# Patient Record
Sex: Male | Born: 1955 | Race: White | Hispanic: No | Marital: Married | Smoking: Never smoker
Health system: Southern US, Community
[De-identification: ages and names within clinical notes are randomized; demographics above are authoritative.]

## PROBLEM LIST (undated history)

## (undated) DIAGNOSIS — C801 Malignant (primary) neoplasm, unspecified: Secondary | ICD-10-CM

## (undated) DIAGNOSIS — Z22322 Carrier or suspected carrier of Methicillin resistant Staphylococcus aureus: Secondary | ICD-10-CM

## (undated) DIAGNOSIS — K219 Gastro-esophageal reflux disease without esophagitis: Secondary | ICD-10-CM

## (undated) DIAGNOSIS — E785 Hyperlipidemia, unspecified: Secondary | ICD-10-CM

## (undated) DIAGNOSIS — I10 Essential (primary) hypertension: Secondary | ICD-10-CM

## (undated) HISTORY — DX: Essential (primary) hypertension: I10

## (undated) HISTORY — DX: Gastro-esophageal reflux disease without esophagitis: K21.9

## (undated) HISTORY — DX: Hyperlipidemia, unspecified: E78.5

---

## 2002-08-05 ENCOUNTER — Ambulatory Visit (HOSPITAL_COMMUNITY): Admission: RE | Admit: 2002-08-05 | Discharge: 2002-08-05 | Payer: Self-pay

## 2002-08-11 ENCOUNTER — Ambulatory Visit (HOSPITAL_COMMUNITY): Admission: RE | Admit: 2002-08-11 | Discharge: 2002-08-11 | Payer: Self-pay

## 2009-10-22 HISTORY — PX: POLYPECTOMY: SHX149

## 2009-10-22 HISTORY — PX: COLONOSCOPY: SHX174

## 2009-11-22 ENCOUNTER — Ambulatory Visit: Payer: Self-pay | Admitting: Family Medicine

## 2009-11-22 DIAGNOSIS — R030 Elevated blood-pressure reading, without diagnosis of hypertension: Secondary | ICD-10-CM | POA: Insufficient documentation

## 2009-11-22 DIAGNOSIS — K219 Gastro-esophageal reflux disease without esophagitis: Secondary | ICD-10-CM | POA: Insufficient documentation

## 2009-11-22 LAB — CONVERTED CEMR LAB
ALT: 28 units/L (ref 0–53)
AST: 29 units/L (ref 0–37)
Albumin: 4.4 g/dL (ref 3.5–5.2)
Alkaline Phosphatase: 50 units/L (ref 39–117)
BUN: 18 mg/dL (ref 6–23)
Basophils Absolute: 0 10*3/uL (ref 0.0–0.1)
Basophils Relative: 0.7 % (ref 0.0–3.0)
Bilirubin Urine: NEGATIVE
Bilirubin, Direct: 0.1 mg/dL (ref 0.0–0.3)
CO2: 30 meq/L (ref 19–32)
Calcium: 9.2 mg/dL (ref 8.4–10.5)
Chloride: 108 meq/L (ref 96–112)
Cholesterol: 250 mg/dL — ABNORMAL HIGH (ref 0–200)
Creatinine, Ser: 1.1 mg/dL (ref 0.4–1.5)
Direct LDL: 182.4 mg/dL
Eosinophils Absolute: 0.1 10*3/uL (ref 0.0–0.7)
Eosinophils Relative: 2.2 % (ref 0.0–5.0)
GFR calc non Af Amer: 73.86 mL/min (ref >60)
Glucose, Bld: 97 mg/dL (ref 70–99)
Glucose, Urine, Semiquant: NEGATIVE
HCT: 43.2 % (ref 39.0–52.0)
HDL: 51.8 mg/dL (ref >39.00)
Hemoglobin: 14.4 g/dL (ref 13.0–17.0)
Ketones, urine, test strip: NEGATIVE
Lymphocytes Relative: 24.1 % (ref 12.0–46.0)
Lymphs Abs: 1.5 10*3/uL (ref 0.7–4.0)
MCHC: 33.3 g/dL (ref 30.0–36.0)
MCV: 99.3 fL (ref 78.0–100.0)
Monocytes Absolute: 0.5 10*3/uL (ref 0.1–1.0)
Monocytes Relative: 7.9 % (ref 3.0–12.0)
Neutro Abs: 4 10*3/uL (ref 1.4–7.7)
Neutrophils Relative %: 65.1 % (ref 43.0–77.0)
Nitrite: NEGATIVE
PSA: 1.73 ng/mL (ref 0.10–4.00)
Platelets: 183 10*3/uL (ref 150.0–400.0)
Potassium: 4.6 meq/L (ref 3.5–5.1)
Protein, U semiquant: NEGATIVE
RBC: 4.35 M/uL (ref 4.22–5.81)
RDW: 12 % (ref 11.5–14.6)
Sodium: 142 meq/L (ref 135–145)
Specific Gravity, Urine: 1.025
TSH: 0.94 microintl units/mL (ref 0.35–5.50)
Total Bilirubin: 0.6 mg/dL (ref 0.3–1.2)
Total CHOL/HDL Ratio: 5
Total Protein: 7.4 g/dL (ref 6.0–8.3)
Triglycerides: 92 mg/dL (ref 0.0–149.0)
Urobilinogen, UA: 0.2
VLDL: 18.4 mg/dL (ref 0.0–40.0)
WBC Urine, dipstick: NEGATIVE
WBC: 6.1 10*3/uL (ref 4.5–10.5)
pH: 5

## 2009-11-23 ENCOUNTER — Telehealth: Payer: Self-pay | Admitting: Family Medicine

## 2009-11-28 ENCOUNTER — Encounter (INDEPENDENT_AMBULATORY_CARE_PROVIDER_SITE_OTHER): Payer: Self-pay | Admitting: *Deleted

## 2009-12-19 ENCOUNTER — Ambulatory Visit: Payer: Self-pay | Admitting: Gastroenterology

## 2009-12-19 DIAGNOSIS — R1310 Dysphagia, unspecified: Secondary | ICD-10-CM | POA: Insufficient documentation

## 2009-12-20 ENCOUNTER — Ambulatory Visit: Payer: Self-pay | Admitting: Family Medicine

## 2009-12-20 ENCOUNTER — Ambulatory Visit: Payer: Self-pay | Admitting: Gastroenterology

## 2009-12-21 ENCOUNTER — Telehealth: Payer: Self-pay | Admitting: Gastroenterology

## 2009-12-26 ENCOUNTER — Ambulatory Visit: Payer: Self-pay | Admitting: Gastroenterology

## 2009-12-29 ENCOUNTER — Ambulatory Visit: Payer: Self-pay | Admitting: Cardiology

## 2009-12-30 ENCOUNTER — Encounter: Payer: Self-pay | Admitting: Gastroenterology

## 2010-11-21 NOTE — Progress Notes (Signed)
Summary: PT RETURNING CALL  Phone Note Call from Patient   Caller: Patient [phone] Reason for Call: Talk to Nurse, Talk to Doctor Summary of Call: Pt adv that he was returning a call to [facility].... Pt adv that his POE told him that Dr Nelida Meuse office had called to speak with him..... Pt adv that he can be reached @ [phone].  Initial call taken by: Debbra Riding,  November 23, 2009 11:26 AM  Follow-up for Phone Call        pt would like his lab results [phone] cell number Follow-up by: Kern Reap CMA Duncan Dull),  November 24, 2009 10:55 AM  Additional Follow-up for Phone Call Additional follow up Details #1::        I reviewed his laboratory findings.  He will go on a fat-free diet.  Follow-up lipid panel may then follow-up with me after fasting labs.  Urology consult because of hematuria.  He will call to make his own appointment Additional Follow-up by: Roderick Pee MD,  November 24, 2009 2:34 PM

## 2010-11-21 NOTE — Assessment & Plan Note (Signed)
Summary: 4 wk rov/jlp   Vital Signs:  Patient profile:   55 year old male Weight:      235 pounds BMI:     31.99 Temp:     97.8 degrees F oral BP sitting:   124 / 90  (left arm) Cuff size:   regular  Vitals Entered By: Raechel Ache, RN (December 20, 2009 10:20 AM) CC: 1 mo ROV.   CC:  1 mo ROV.Marland Kitchen  History of Present Illness: Andrew Fuentes is a 55 year old male, who comes back today for reevaluation of hypertension?  He was seen previously and blood pressure was elevated.  He purchased a digital blood pressure cuff has been monitoring his blood pressure at home.  Home blood pressures are 120 to 130 systolic, 70 to 80 diastolic.  We also referred him to GI.  He stated to have upper endoscopy for evaluation of a possible stricture.  Allergies: No Known Drug Allergies  Past History:  Past medical, surgical, family and social histories (including risk factors) reviewed for relevance to current acute and chronic problems.  Past Medical History: Reviewed history from 11/22/2009 and no changes required. 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 broken left wrist right hand nerve   Family History: Reviewed history from 11/22/2009 and no changes required. Father: copd Mother: deceased - lung disease Siblings: 1 brother - obese               2 sisters - healthy               2 children - healthy  Social History: Reviewed history from 11/22/2009 and no changes required. 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 Take one by mouth two times a day 2)  Multivitamins Tabs (Multiple vitamin) .... Take one by mouth once daily 3)  Vitamin D3 1000 Unit Caps (Cholecalciferol) .... Take one by mouth once daily 4)  Moviprep 100 Gm Solr (Peg-kcl-nacl-nasulf-na asc-c) .... As per prep instructions.  Patient Instructions: 1)  check your blood pressure weekly at home.  Return p.r.n.

## 2010-11-21 NOTE — Assessment & Plan Note (Signed)
Summary: BRAND NEW PT TO EST/CJR   Vital Signs:  Patient profile:   55 year old male Height:      72 inches Weight:      233 pounds BMI:     31.71 Temp:     98.6 degrees F oral BP sitting:   140 / 100  (left arm) Cuff size:   regular  Vitals Entered By: Kern Reap CMA Duncan Dull) (November 22, 2009 3:10 PM)  Reason for Visit new patient to establish  History of Present Illness: Andrew Fuentes  is a 55 -year-old, married male, nonsmoker who comes in today for evaluation as a new patient.  He has a history of recent onset elevation of blood pressure.  BP by me today 140/90 right arm sitting position.  Negative family history of hypertension.  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  He is also having trouble with reflux esophagitis.  He gets a funny taste in his throat.  Also is having recent difficulty swallowing food.  Review of systems otherwise negative  Preventive Screening-Counseling & Management  Alcohol-Tobacco     Smoking Status: never  Hep-HIV-STD-Contraception     Dental Visit-last 6 months no      Drug Use:  no.    Allergies (verified): No Known Drug Allergies  Past History:  Past medical, surgical, family and social histories (including risk factors) reviewed, and no changes noted (except as noted below).  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 Father: copd Mother: deceased - lung disease Siblings: 1 brother - obese               2 sisters - healthy               2 children - healthy  Social History: Reviewed history and no changes required. 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 No apparent alopecia or balding. Eyes:  No corneal or conjunctival inflammation noted. EOMI. Perrla. Funduscopic exam benign, without hemorrhages, exudates or papilledema. Vision grossly normal. Ears:  External ear exam shows no significant lesions or deformities.  Otoscopic examination reveals clear canals, tympanic membranes are intact bilaterally without bulging, retraction, inflammation or discharge. Hearing is grossly normal bilaterally. Nose:  External nasal examination shows no deformity or inflammation. Nasal mucosa are pink and moist without lesions or exudates. Mouth:  Oral mucosa and oropharynx without lesions or exudates.  Teeth in good repair. Neck:  No deformities, masses, or tenderness noted. Chest Wall:  No deformities, masses, tenderness or gynecomastia noted. Breasts:  No masses or gynecomastia noted Lungs:  Normal respiratory effort, chest expands symmetrically. Lungs are clear to auscultation, no crackles or wheezes. Heart:  Normal rate and regular rhythm. S1 and S2 normal without gallop, murmur, click, rub or other extra sounds. Abdomen:  Bowel sounds positive,abdomen soft and non-tender without masses, organomegaly or hernias noted. Rectal:  No external abnormalities noted. Normal sphincter tone. No rectal masses or tenderness. Genitalia:  Testes bilaterally descended without nodularity, tenderness or masses. No scrotal  masses or lesions. No penis lesions or urethral discharge. Prostate:  Prostate gland firm and smooth, no enlargement, nodularity, tenderness, mass, asymmetry or induration. Msk:  No deformity or scoliosis noted of thoracic or lumbar spine.   Pulses:  R and L carotid,radial,femoral,dorsalis pedis and posterior tibial pulses are full and equal bilaterally Extremities:  No  clubbing, cyanosis, edema, or deformity noted with normal full range of motion of all joints.   Neurologic:  No cranial nerve deficits noted. Station and gait are normal. Plantar reflexes are down-going bilaterally. DTRs are symmetrical throughout. Sensory, motor and coordinative functions appear intact. Skin:  Intact without suspicious lesions or rashes Cervical Nodes:  No lymphadenopathy noted Axillary Nodes:  No palpable lymphadenopathy Inguinal Nodes:  No significant adenopathy Psych:  Cognition and judgment appear intact. Alert and cooperative with normal attention span and concentration. No apparent delusions, illusions, hallucinations   Impression & Recommendations:  Problem # 1:  GERD (ICD-530.81) Assessment New  Orders: Venipuncture (52841) TLB-Lipid Panel (80061-LIPID) TLB-BMP (Basic Metabolic Panel-BMET) (80048-METABOL) TLB-CBC Platelet - w/Differential (85025-CBCD) TLB-Hepatic/Liver Function Pnl (80076-HEPATIC) TLB-TSH (Thyroid Stimulating Hormone) (84443-TSH) TLB-PSA (Prostate Specific Antigen) (84153-PSA) UA Dipstick w/o Micro (automated)  (81003) Gastroenterology Referral (GI)  Problem # 2:  ELEVATED BLOOD PRESSURE WITHOUT DIAGNOSIS OF HYPERTENSION (ICD-796.2) Assessment: New  Orders: Venipuncture (32440) TLB-Lipid Panel (80061-LIPID) TLB-BMP (Basic Metabolic Panel-BMET) (80048-METABOL) TLB-CBC Platelet - w/Differential (85025-CBCD) TLB-Hepatic/Liver Function Pnl (80076-HEPATIC) TLB-TSH (Thyroid Stimulating Hormone) (84443-TSH) TLB-PSA (Prostate Specific Antigen) (84153-PSA) UA Dipstick w/o Micro (automated)  (81003) EKG w/ Interpretation (93000)  Problem # 3:  Preventive Health Care (ICD-V70.0) Assessment: New  Patient Instructions: 1)  purchase a digital blood pressure cuff measure your blood pressure every morning.  Return in 4 weeks for follow-up with the data and the device. 2)   nothing to eat or drink for 3 hours prior to bedtime, sleep on two  pillows, take Prilosec OTC 20 mg twice a day.  We will get u  set up a consult in GI   Immunization History:  Tetanus/Td Immunization History:    Tetanus/Td:  historical (10/22/2009)  Laboratory Results   Urine Tests    Routine Urinalysis   Color: yellow Appearance: Clear Glucose: negative   (Normal Range: Negative) Bilirubin: negative   (Normal Range: Negative) Ketone: negative   (Normal Range: Negative) Spec. Gravity: 1.025   (Normal Range: 1.003-1.035) Blood: 2+   (Normal Range: Negative) pH: 5.0   (Normal Range: 5.0-8.0) Protein: negative   (Normal Range: Negative) Urobilinogen: 0.2   (Normal Range: 0-1) Nitrite: negative   (Normal Range: Negative) Leukocyte Esterace: negative   (Normal Range: Negative)    Comments: Rita Ohara  November 22, 2009 4:44 PM

## 2010-11-21 NOTE — Letter (Signed)
Summary: [facility] Instructions  [facility]  [address] [place], [place] [address]   Phone: [phone]  Fax: [phone]       Andrew Fuentes    Mar 12, 1956    MRN: [id number]        Procedure Day /Date:MONDAY 12/26/2009     Arrival Time:2:30PM     Procedure Time:3:30PM     Location of Procedure:                    X   [facility] (4th Floor)   PREPARATION FOR COLONOSCOPY WITH MOVIPREP   Starting 5 days prior to your procedure3/11/2009 do not eat nuts, seeds, popcorn, corn, beans, peas,  salads, or any raw vegetables.  Do not take any fiber supplements (e.g. Metamucil, Citrucel, and Benefiber).  THE DAY BEFORE YOUR PROCEDURE         DATE: 12/25/2009 DAY: SUNDAY  1.  Drink clear liquids the entire day-NO SOLID FOOD  2.  Do not drink anything colored red or purple.  Avoid juices with pulp.  No orange juice.  3.  Drink at least 64 oz. (8 glasses) of fluid/clear liquids during the day to prevent dehydration and help the prep work efficiently.  CLEAR LIQUIDS INCLUDE: Water Jello Ice Popsicles Tea (sugar ok, no milk/cream) Powdered fruit flavored drinks Coffee (sugar ok, no milk/cream) Gatorade Juice: apple, white grape, white cranberry  Lemonade Clear bullion, consomm, broth Carbonated beverages (any kind) Strained chicken noodle soup Hard Candy                             4.  In the morning, mix first dose of MoviPrep solution:    Empty 1 Pouch A and 1 Pouch B into the disposable container    Add lukewarm drinking water to the top line of the container. Mix to dissolve    Refrigerate (mixed solution should be used within 24 hrs)  5.  Begin drinking the prep at 5:00 p.m. The MoviPrep container is divided by 4 marks.   Every 15 minutes drink the solution down to the next mark (approximately 8 oz) until the full liter is complete.   6.  Follow completed prep with 16 oz of clear liquid of your choice (Nothing red or purple).  Continue to  drink clear liquids until bedtime.  7.  Before going to bed, mix second dose of MoviPrep solution:    Empty 1 Pouch A and 1 Pouch B into the disposable container    Add lukewarm drinking water to the top line of the container. Mix to dissolve    Refrigerate  THE DAY OF YOUR PROCEDURE      DATE: 12/26/2009  DAY: MONDAY  Beginning at 10:30a.m. (5 hours before procedure):         1. Every 15 minutes, drink the solution down to the next mark (approx 8 oz) until the full liter is complete.  2. Follow completed prep with 16 oz. of clear liquid of your choice.    3. You may drink clear liquids until 1:30PM(2 HOURS BEFORE PROCEDURE).   MEDICATION INSTRUCTIONS  Unless otherwise instructed, you should take regular prescription medications with a small sip of water   as early as possible the morning of your procedure.        OTHER INSTRUCTIONS  You will need a responsible adult at least 56 years of age to accompany you and drive you  home.   This person must remain in the waiting room during your procedure.  Wear loose fitting clothing that is easily removed.  Leave jewelry and other valuables at home.  However, you may wish to bring a book to read or  an iPod/MP3 player to listen to music as you wait for your procedure to start.  Remove all body piercing jewelry and leave at home.  Total time from sign-in until discharge is approximately 2-3 hours.  You should go home directly after your procedure and rest.  You can resume normal activities the  day after your procedure.  The day of your procedure you should not:   Drive   Make legal decisions   Operate machinery   Drink alcohol   Return to work  You will receive specific instructions about eating, activities and medications before you leave.    The above instructions have been reviewed and explained to me by   _______________________    I fully understand and can verbalize these instructions  _____________________________ Date _________

## 2010-11-21 NOTE — Progress Notes (Signed)
Summary: CTA Scheduled  Phone Note Outgoing Call   Call placed by: Laureen Ochs LPN,  December 21, 2009 9:27 AM Call placed to: Patient Summary of Call: On Endoscopy 12-20-09 pt. 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 He is scheduled for a CT at [facility] on 12-29-09 at 9:30am. Appt. information reviewed w/pt. by phone and his daughter Kelton Pillar will take his contrast and written instructions to him. Pt. instructed to call back as needed.  Initial call taken by: Laureen Ochs LPN,  December 21, 2009 9:34 AM

## 2010-11-21 NOTE — Procedures (Signed)
Summary: Colonoscopy  Patient: Andrew Fuentes Note: All result statuses are Final unless otherwise noted.  Tests: (1) Colonoscopy (COL)   COL Colonoscopy           DONE     [facility]     [address]     [place], [place]  [address]           COLONOSCOPY PROCEDURE REPORT           PATIENT:  Andrew Fuentes, Andrew Fuentes  MR#:  [id number]     BIRTHDATE:  [birth year], 54 yrs. old  GENDER:  male           ENDOSCOPIST:  Barbette Hair. Arlyce Dice, MD     Referred by:           PROCEDURE DATE:  12/26/2009     PROCEDURE:  Colonoscopy with snare polypectomy, Colon with cold     biopsy polypectomy     ASA CLASS:  Class II     INDICATIONS:  Routine Risk Screening           MEDICATIONS:   Fentanyl 75 mcg IV, Versed 9 mg IV           DESCRIPTION OF PROCEDURE:   After the risks benefits and     alternatives of the procedure were thoroughly explained, informed     consent was obtained.  Digital rectal exam was performed and     revealed no abnormalities.   The LB [id number] endoscope     was introduced through the anus and advanced to the cecum, which     was identified by both the appendix and ileocecal valve, without     limitations.  The quality of the prep was good, using MoviPrep.     The instrument was then slowly withdrawn as the colon was fully     examined.     <<PROCEDUREIMAGES>>           FINDINGS:  A sessile polyp was found in the cecum. It was 2 mm in     size. The polyp was removed using cold biopsy forceps (see     image5).  A sessile polyp was found in the ascending colon. It was     3 mm in size. Polyp was snared without cautery. Retrieval was     successful (see image7). snare polyp  This was otherwise a normal     examination of the colon (see image1, image2, image8, image9,     image10, image11, image13, image14, image17, image16, and     image18).   Retroflexed views in the rectum revealed no     abnormalities.    The scope was then withdrawn from the patient     and  the procedure completed.           COMPLICATIONS:  None           ENDOSCOPIC IMPRESSION:     1) 2 mm sessile polyp in the cecum     2) 3 mm sessile polyp in the ascending colon     3) Otherwise normal examination     RECOMMENDATIONS:     1) If the polyp(s) removed today are proven to be adenomatous     (pre-cancerous) polyps, you will need a repeat colonoscopy in 5     years. Otherwise you should continue to follow colorectal cancer     screening guidelines for "routine risk" patients with colonoscopy     in 10 years.  REPEAT EXAM:   You will receive a letter from Dr. Arlyce Dice in 1-2     weeks, after reviewing the final pathology, with followup     recommendations.           ______________________________     Barbette Hair Arlyce Dice, MD           CC:  Roderick Pee, MD           n.     Rosalie Doctor:   Barbette Hair. Kaplan at 12/26/2009 04:25 PM           Ramond Craver, [phone]  Note: An exclamation mark (!) indicates a result that was not dispersed into the flowsheet. Document Creation Date: 12/26/2009 4:25 PM _______________________________________________________________________  (1) Order result status: Final Collection or observation date-time: 12/26/2009 16:19 Requested date-time:  Receipt date-time:  Reported date-time:  Referring Physician:   Ordering Physician: Melvia Heaps [phone]) Specimen Source:  Source: Launa Grill Order Number: [id number] Lab site:   Appended Document: Colonoscopy     Procedures Next Due Date:    Colonoscopy: 12/2014

## 2010-11-21 NOTE — Letter (Signed)
Summary: Results Letter  [facility]  [address] [place], [place] [address]   Phone: [phone]  Fax: [phone]        December 19, 2009 MRN: [id number]    Andrew Fuentes [address] [place], [place]  [address]    Dear Mr. Kissinger,  It is my pleasure to have treated you recently as a new patient in my office. I appreciate your confidence and the opportunity to participate in your care.  Since I do have a busy inpatient endoscopy schedule and office schedule, my office hours vary weekly. I am, however, available for emergency calls everyday through my office. If I am not available for an urgent office appointment, another one of our gastroenterologist will be able to assist you.  My well-trained staff are prepared to help you at all times. For emergencies after office hours, a physician from our Gastroenterology section is always available through my 24 hour answering service  Once again I welcome you as a new patient and I look forward to a happy and healthy relationship            Sincerely,  Louis Meckel MD  This letter has been electronically signed by your physician.  Appended Document: Results Letter letter mailed

## 2010-11-21 NOTE — Letter (Signed)
Summary: EGD Instructions  [facility]  [address] [place], [place] [address]   Phone: [phone]  Fax: [phone]       Andrew Fuentes    [birth year]    MRN: [id number]       Procedure Day /Date:TUESDAY 12/20/2009     Arrival Time: 2:30PM     Procedure Time:3:30PM     Location of Procedure:                    X [facility] (4th Floor)    PREPARATION FOR ENDOSCOPY/DIL   On 12/20/2009  THE DAY OF THE PROCEDURE:  1.   No solid foods, milk or milk products are allowed after midnight the night before your procedure.  2.   Do not drink anything colored red or purple.  Avoid juices with pulp.  No orange juice.  3.  You may drink clear liquids until 1:30PM, which is 2 hours before your procedure.                                                                                                CLEAR LIQUIDS INCLUDE: Water Jello Ice Popsicles Tea (sugar ok, no milk/cream) Powdered fruit flavored drinks Coffee (sugar ok, no milk/cream) Gatorade Juice: apple, white grape, white cranberry  Lemonade Clear bullion, consomm, broth Carbonated beverages (any kind) Strained chicken noodle soup Hard Candy   MEDICATION INSTRUCTIONS  Unless otherwise instructed, you should take regular prescription medications with a small sip of water as early as possible the morning of your procedure.           OTHER INSTRUCTIONS  You will need a responsible adult at least 56 years of age to accompany you and drive you home.   This person must remain in the waiting room during your procedure.  Wear loose fitting clothing that is easily removed.  Leave jewelry and other valuables at home.  However, you may wish to bring a book to read or an iPod/MP3 player to listen to music as you wait for your procedure to start.  Remove all body piercing jewelry and leave at home.  Total time from sign-in until discharge is approximately 2-3 hours.  You should go home  directly after your procedure and rest.  You can resume normal activities the day after your procedure.  The day of your procedure you should not:   Drive   Make legal decisions   Operate machinery   Drink alcohol   Return to work  You will receive specific instructions about eating, activities and medications before you leave.    The above instructions have been reviewed and explained to me by   _______________________    I fully understand and can verbalize these instructions _____________________________ Date _________

## 2010-11-21 NOTE — Letter (Signed)
Summary: Results Letter  [facility]  [address] [place], [place] [address]   Phone: [phone]  Fax: [phone]        December 30, 2009 MRN: [id number]    Andrew Fuentes [address] [place], [place]  [address]    Dear Mr. [name],  Your CT results did not show any remarkable findings. There is a prominent artery which explains the findings on endosocpy.  There is no aneurism.  Please continue with the recommendations previously discussed.  Should you have any further questions or immediate concers, feel free to contact me.  Sincerely,  Barbette Hair. Arlyce Dice, M.D., [facility]          Sincerely,  Louis Meckel MD  This letter has been electronically signed by your physician.  Appended Document: Results Letter Letter mailed to patient.

## 2010-11-21 NOTE — Procedures (Signed)
Summary: Upper Endoscopy  Patient: Osher Oettinger Note: All result statuses are Final unless otherwise noted.  Tests: (1) Upper Endoscopy (EGD)   EGD Upper Endoscopy       DONE     [facility]     [address]     [place], [place]  [address]           ENDOSCOPY PROCEDURE REPORT           PATIENT:  Hilmar, Moldovan  MR#:  [id number]     BIRTHDATE:  1956-07-15, 54 yrs. old  GENDER:  male           ENDOSCOPIST:  Barbette Hair. Arlyce Dice, MD     Referred by:  Eugenio Hoes Tawanna Cooler, M.D.           PROCEDURE DATE:  12/20/2009     PROCEDURE:  EGD, diagnostic, Maloney Dilation of Esophagus     ASA CLASS:  Class II     INDICATIONS:  dysphagia           MEDICATIONS:   Fentanyl 50 mcg IV, Versed 7 mg IV, glycopyrrolate     (Robinal) 0.2 mg IV, 0.6cc simethancone 0.6 cc PO     TOPICAL ANESTHETIC:  Exactacain Spray           DESCRIPTION OF PROCEDURE:   After the risks benefits and     alternatives of the procedure were thoroughly explained, informed     consent was obtained.  The LB GIF-H180 [id number] endoscope was     introduced through the mouth and advanced to the third portion of     the duodenum, without limitations.  The instrument was slowly     withdrawn as the mucosa was fully examined.     <<PROCEDUREIMAGES>>           A stricture was found at the gastroesophageal junction (see     image1). Early esophageal stricture Dilation with maloney dilator     18mm Mild resistance; no heme  A mass was found in the fundus (see     image3). 3cm pulsatile submucosal mass; normal overlying mucosa     Otherwise the examination was normal.    Retroflexed views revealed     no abnormalities.    The scope was then withdrawn from the patient     and the procedure completed.           COMPLICATIONS:  None           ENDOSCOPIC IMPRESSION:     1) Stricture at the gastroesophageal junction     2) Submucosal mass in the fundus - r/o aneurism     3) Otherwise normal examination      RECOMMENDATIONS:     1) dilatations PRN     2) CT Scan           REPEAT EXAM:  No           ______________________________     Barbette Hair. Arlyce Dice, MD           CC:           n.     eSIGNED:   Barbette Hair. Jaquis Picklesimer at 12/20/2009 04:14 PM           Ramond Craver, [id number]  Note: An exclamation mark (!) indicates a result that was not dispersed into the flowsheet. Document Creation Date: 12/20/2009 4:14 PM _______________________________________________________________________  (1) Order result status: Final Collection or observation date-time: 12/20/2009 16:08 Requested  date-time:  Receipt date-time:  Reported date-time:  Referring Physician:   Ordering Physician: Melvia Heaps [phone]) Specimen Source:  Source: Launa Grill Order Number: [id number] Lab site:

## 2010-11-21 NOTE — Assessment & Plan Note (Signed)
Summary: Problems swallowing--ch.   History of Present Illness Visit Type: Initial Consult Primary GI MD: Melvia Heaps MD [facility] Primary Provider: Kelle Darting, MD Requesting Provider: Kelle Darting, MD Chief Complaint: Patient complains of acid refluc and belching. Patient complains of dysphagia that started years ago. History of Present Illness:   Andrew Fuentes is a 55 year old white male referred at the request of Dr. Tawanna Cooler for evaluation of dysphagia.  Over the past year he has had progressive dysphagia to solids and now liquids.  Weight has been stable.  He has occasional belching and pyrosis.  He is on Prilosec twice a day.   GI Review of Systems    Reports acid reflux, belching, dysphagia with liquids, and  dysphagia with solids.      Denies abdominal pain, bloating, chest pain, heartburn, loss of appetite, nausea, vomiting, vomiting blood, weight loss, and  weight gain.        Denies anal fissure, black tarry stools, change in bowel habit, constipation, diarrhea, diverticulosis, fecal incontinence, heme positive stool, hemorrhoids, irritable bowel syndrome, jaundice, light color stool, liver problems, rectal bleeding, and  rectal pain.    Current Medications (verified): 1)  Prilosec Otc 20 Mg Tbec (Omeprazole Magnesium) .... Take One By Mouth Two Times A Day 2)  Multivitamins  Tabs (Multiple Vitamin) .... Take One By Mouth Once Daily 3)  Vitamin D3 1000 Unit Caps (Cholecalciferol) .... Take One By Mouth Once Daily  Allergies (verified): No Known Drug Allergies  Past History:  Past Medical History: Reviewed history from 11/22/2009 and no changes required. 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 broken left wrist right hand nerve   Family History: Father: copd Mother: deceased - lung disease Siblings: 1 brother - obese               2 sisters -  healthy               2 children - healthy No FH of Colon Cancer:  Social History: Occupation:mechanic Married Never Smoked Alcohol use-yes Drug use-no Daily Caffeine Use 2 per day  Review of Systems       The patient complains of blood in urine, cough, and headaches-new.  The patient denies allergy/sinus, anemia, anxiety-new, arthritis/joint pain, back pain, breast changes/lumps, change in vision, confusion, coughing up blood, depression-new, fainting, fatigue, fever, hearing problems, heart murmur, heart rhythm changes, itching, menstrual pain, muscle pains/cramps, night sweats, nosebleeds, pregnancy symptoms, shortness of breath, skin rash, sleeping problems, sore throat, swelling of feet/legs, swollen lymph glands, thirst - excessive , urination - excessive , urination changes/pain, urine leakage, vision changes, and voice change.         All other systems were reviewed and were negative   Vital Signs:  Patient profile:   55 year old male Height:      72 inches Weight:      230.4 pounds BMI:     31.36 Pulse rate:   64 / minute Pulse rhythm:   regular BP sitting:   120 / 86  (left arm) Cuff size:   regular  Vitals Entered By: Harlow Mares CMA Duncan Dull) (December 19, 2009 11:10 AM)  Physical Exam  Additional Exam:  On physical exam he is well-developed well-nourished male  skin: anicteric HEENT: normocephalic; PEERLA; no nasal or pharyngeal abnormalities neck: supple nodes: no cervical lymphadenopathy chest: clear to ausculatation and percussion heart: no murmurs,  gallops, or rubs abd: soft, nontender; BS normoactive; no abdominal masses, tenderness, organomegaly rectal: deferred ext: no cynanosis, clubbing, edema skeletal: no deformities neuro: oriented x 3; no focal abnormalities    Impression & Recommendations:  Problem # 1:  DYSPHAGIA UNSPECIFIED (ICD-787.20)  He 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  Recommendations #1 upper endoscopy with  dilatation as indicated  Risks, alternatives, and complications of the procedure, including bleeding, perforation, and possible need for surgery, were explained to the patient.  Patient's questions were answered.  Orders: EGD (EGD)  Problem # 2:  GERD (ICD-530.81)  Symptoms are well-controlled with omeprazole.  Plan to continue with the same.  Orders: EGD (EGD)  Problem # 3:  SPECIAL SCREENING MALIG NEOPLASMS OTHER SITES (ICD-V76.49)  Patient will be scheduled for screening colonoscopy.  Orders: Colonoscopy (Colon)  Patient Instructions: 1)  Conscious Sedation brochure given.  2)  Upper Endoscopy with Dilatation brochure given.  3)  Your EGD/DIL is scheduled for 12/20/2009 at 3:30pm 4)  Your Colonoscopy is scheduled for 12/26/2009 at 3:30pm 5)  You can pick up your MoviPrep from your pharmacy today 6)  The medication list was reviewed and reconciled.  All changed / newly prescribed medications were explained.  A complete medication list was provided to the patient / caregiver. Prescriptions: MOVIPREP 100 GM  SOLR (PEG-KCL-NACL-NASULF-NA ASC-C) As per prep instructions.  #1 x 0   Entered by:   Merri Ray CMA (AAMA)   Authorized by:   Louis Meckel MD   Signed by:   Merri Ray CMA (AAMA) on 12/19/2009   Method used:   Electronically to        [facility]  [id number] (retail)       [address]       [place], [place]  [address]       Ph: [phone] or [phone]       Fax: [phone]   RxID:   [id number]

## 2010-11-21 NOTE — Letter (Signed)
Summary: New Patient letter  [facility]  [address] [place], [place] [address]   Phone: [phone]  Fax: [phone]       11/28/2009 MRN: [id number]  Andrew Fuentes [address] [place], [place]  [address]  Dear Andrew Fuentes,  Welcome to the Gastroenterology Division at [facility].    You are scheduled to see Dr. Arlyce Dice on 12-19-09 at 10:45a.m. on the 3rd floor at [facility], [address]  We ask that you try to arrive at our office 15 minutes prior to your appointment time to allow for check-in.  We would like you to complete the enclosed self-administered evaluation form prior to your visit and bring it with you on the day of your appointment.  We will review it with you.  Also, please bring a complete list of all your medications or, if you prefer, bring the medication bottles and we will list them.  Please bring your insurance card so that we may make a copy of it.  If your insurance requires a referral to see a specialist, please bring your referral form from your primary care physician.  Co-payments are due at the time of your visit and may be paid by cash, check or credit card.     Your office visit will consist of a consult with your physician (includes a physical exam), any laboratory testing he/she may order, scheduling of any necessary diagnostic testing (e.g. x-ray, ultrasound, CT-scan), and scheduling of a procedure (e.g. Endoscopy, Colonoscopy) if required.  Please allow enough time on your schedule to allow for any/all of these possibilities.    If you cannot keep your appointment, please call [phone] to cancel or reschedule prior to your appointment date.  This allows [place] the opportunity to schedule an appointment for another patient in need of care.  If you do not cancel or reschedule by 5 p.m. the business day prior to your appointment date, you will be charged a $50.00 late cancellation/no-show fee.    Thank you for choosing  [facility] for your medical needs.  We appreciate the opportunity to care for you.  Please visit [place] at our website  to learn more about our practice.                     Sincerely,                                                             The Gastroenterology Division

## 2010-11-21 NOTE — Letter (Signed)
Summary: Patient Notice- Polyp Results  [facility]  [address] [place], [place] [address]   Phone: [phone]  Fax: [phone]        December 30, 2009 MRN: [id number]    Andrew Fuentes [address] [place], [place]  [address]    Dear Mr. Camplin,  I am pleased to inform you that the colon polyp(s) removed during your recent colonoscopy was (were) found to be benign (no cancer detected) upon pathologic examination.  I recommend you have a repeat colonoscopy examination in 5_ years to look for recurrent polyps, as having colon polyps increases your risk for having recurrent polyps or even colon cancer in the future.  Should you develop new or worsening symptoms of abdominal pain, bowel habit changes or bleeding from the rectum or bowels, please schedule an evaluation with either your primary care physician or with me.  Additional information/recommendations:  __ No further action with gastroenterology is needed at this time. Please      follow-up with your primary care physician for your other healthcare      needs.  __ Please call [phone] to schedule a return visit to review your      situation.  __ Please keep your follow-up visit as already scheduled.  _x_ Continue treatment plan as outlined the day of your exam.  Please call us if you are having persistent problems or have questions about your condition that have not been fully answered at this time.  Sincerely,  Louis Meckel MD  This letter has been electronically signed by your physician.

## 2011-11-13 ENCOUNTER — Other Ambulatory Visit: Payer: Self-pay | Admitting: Dermatology

## 2015-02-24 ENCOUNTER — Encounter: Payer: Self-pay | Admitting: Gastroenterology

## 2015-04-26 ENCOUNTER — Encounter: Payer: Self-pay | Admitting: Gastroenterology

## 2015-06-01 ENCOUNTER — Encounter: Payer: Self-pay | Admitting: Gastroenterology

## 2017-10-22 DIAGNOSIS — Z22322 Carrier or suspected carrier of Methicillin resistant Staphylococcus aureus: Secondary | ICD-10-CM

## 2017-10-22 HISTORY — DX: Carrier or suspected carrier of methicillin resistant Staphylococcus aureus: Z22.322

## 2018-07-11 ENCOUNTER — Encounter (HOSPITAL_COMMUNITY): Payer: Self-pay

## 2018-07-11 ENCOUNTER — Ambulatory Visit (HOSPITAL_COMMUNITY)
Admission: EM | Admit: 2018-07-11 | Discharge: 2018-07-11 | Disposition: A | Discharge status: Home / Self Care | Payer: BLUE CROSS/BLUE SHIELD | Attending: Family Medicine | Admitting: Family Medicine

## 2018-07-11 DIAGNOSIS — K219 Gastro-esophageal reflux disease without esophagitis: Secondary | ICD-10-CM | POA: Insufficient documentation

## 2018-07-11 DIAGNOSIS — L02411 Cutaneous abscess of right axilla: Secondary | ICD-10-CM

## 2018-07-11 DIAGNOSIS — L739 Follicular disorder, unspecified: Secondary | ICD-10-CM | POA: Diagnosis not present

## 2018-07-11 DIAGNOSIS — R030 Elevated blood-pressure reading, without diagnosis of hypertension: Secondary | ICD-10-CM | POA: Diagnosis not present

## 2018-07-11 MED ORDER — DOXYCYCLINE HYCLATE 100 MG PO CAPS: 100.0000 mg | ORAL_CAPSULE | Freq: Two times a day (BID) | ORAL | 0 refills | Status: AC

## 2018-07-11 NOTE — Discharge Instructions (Addendum)
Recommend add Doxycycline 100mg  twice a day as directed. Continue Bactrim as directed. Continue to apply warm compresses to area to help with drainage. Call [facility] today to schedule appointment for further evaluation- may need additional I & D and deep packing. Follow-up with Surgeon as planned or go to the ER if pain, swelling worsens.

## 2018-07-11 NOTE — ED Provider Notes (Signed)
[facility]    CSN: [id number] Arrival date & time: 07/11/18  4097     History   Chief Complaint Chief Complaint  Patient presents with  . Abscess    Under Right Arm    HPI Andrew Fuentes is a 62 y.o. male.   61 year old male presents with recurrent abscess under his right axilla. Started as a small bump about 2 weeks ago that was itching and he thought it was a bug bite. Then additional small bumps came up under his right arm. One became an abscess that was I & D by [facility] about 12 days ago and he was placed on Clindamycin for 10 days. That abscess slowly resolved but one of the other "bumps" now has become more red and swollen in the past 3 days. He returned to [facility] and was started on Bactrim 2 days ago along with Hibiclens skin wash. Wound culture was not obtained. This abscess is draining slowly but continues to get larger and spreading and slightly painful. Concerned that the infection is getting worse. Now also having a few small red bumps appear under his left axilla. No previous history of folliculitis or abscesses. No other chronic health issues. Takes no daily medication.   The history is provided by the patient.  Abscess  Location:  Torso Torso abscess location:  R axilla Size:  2cm by 3cm oval Abscess quality: draining, fluctuance, painful, redness and warmth   Abscess quality: no itching   Red streaking: no   Duration:  3 days Progression:  Worsening Pain details:    Quality:  Pressure and throbbing   Severity:  Mild   Timing:  Constant Chronicity:  Recurrent Context: insect bite/sting (possible but unknown)   Context: not diabetes, not immunosuppression, not injected drug use and not skin injury   Relieved by:  Warm compresses, draining/squeezing and oral antibiotics Ineffective treatments:  Oral antibiotics, draining/squeezing and warm compresses Associated symptoms: no anorexia, no fatigue, no fever,  no headaches, no nausea and no vomiting   Risk factors: no family hx of MRSA and no hx of MRSA     History reviewed. No pertinent past medical history.  Patient Active Problem List   Diagnosis Date Noted  . DYSPHAGIA UNSPECIFIED 12/19/2009  . GERD 11/22/2009  . ELEVATED BLOOD PRESSURE WITHOUT DIAGNOSIS OF HYPERTENSION 11/22/2009    History reviewed. No pertinent surgical history.     Home Medications    Prior to Admission medications   Medication Sig Start Date End Date Taking? Authorizing Provider  sulfamethoxazole-trimethoprim (BACTRIM DS,SEPTRA DS) 800-160 MG tablet Take 1 tablet by mouth 2 (two) times daily.   Yes [provider]  doxycycline (VIBRAMYCIN) 100 MG capsule Take 1 capsule (100 mg total) by mouth 2 (two) times daily for 10 days. 07/11/18 07/21/18  Katy Apo, NP    Family History History reviewed. No pertinent family history.  Social History Social History   Tobacco Use  . Smoking status: Not on file  Substance Use Topics  . Alcohol use: Not on file  . Drug use: Not on file     Allergies   Tape   Review of Systems Review of Systems  Constitutional: Negative for activity change, appetite change, chills, fatigue and fever.  Respiratory: Negative for cough, chest tightness and shortness of breath.   Gastrointestinal: Negative for abdominal pain, anorexia, nausea and vomiting.  Musculoskeletal: Negative for arthralgias and myalgias.  Skin: Positive for color  change, rash and wound.  Allergic/Immunologic: Negative for immunocompromised state.  Neurological: Negative for dizziness, tremors, seizures, syncope, weakness, light-headedness, numbness and headaches.  Hematological: Negative for adenopathy. Does not bruise/bleed easily.  Psychiatric/Behavioral: Negative.      Physical Exam Triage Vital Signs ED Triage Vitals [07/11/18 1017]  Enc Vitals Group     BP (!) 153/84     Pulse Rate 65     Resp 20     Temp 98.5 F (36.9 C)      Temp Source Oral     SpO2 100 %     Weight      Height      Head Circumference      Peak Flow      Pain Score      Pain Loc      Pain Edu?      Excl. in [place]?    No data found.  Updated Vital Signs BP (!) 153/84 (BP Location: Left Arm)   Pulse 65   Temp 98.5 F (36.9 C) (Oral)   Resp 20   SpO2 100%   Visual Acuity Right Eye Distance:   Left Eye Distance:   Bilateral Distance:    Right Eye Near:   Left Eye Near:    Bilateral Near:     Physical Exam  Constitutional: He is oriented to person, place, and time. He appears well-developed and well-nourished. He is cooperative. He does not appear ill. No distress.  Patient sitting comfortably on exam table in no acute distress.   HENT:  Head: Normocephalic and atraumatic.  Eyes: Conjunctivae and EOM are normal.  Neck: Normal range of motion.  Cardiovascular: Normal rate.  Pulmonary/Chest: Effort normal.  Musculoskeletal: Normal range of motion.  Neurological: He is alert and oriented to person, place, and time.  Skin: Skin is warm. Capillary refill takes less than 2 seconds. Rash noted. No bruising, no ecchymosis, no laceration and no petechiae noted. Rash is papular and pustular. There is erythema.     2cm by 3cm oval red abscess with 10mm opening in center draining yellowish-red thick fluid. Central area is hard with possible track traveling down more medial area of axilla. Tender only at center of abscess. Scar from previous abscess which has healed just below this abscess. Also a few scattered red papule lesions also present under axilla and skin irritation surrounding area from tape.   About 10 small pink papular with one pustular lesion present under left axilla. No drainage or tenderness present.    Psychiatric: He has a normal mood and affect. His behavior is normal. Judgment and thought content normal.  Vitals reviewed.    UC Treatments / Results  Labs (all labs ordered are listed, but only abnormal results are  displayed) Labs Reviewed  AEROBIC CULTURE (SUPERFICIAL SPECIMEN)    EKG None  Radiology No results found.  Procedures Procedures (including critical care time)  Medications Ordered in UC Medications - No data to display  Initial Impression / Assessment and Plan / UC Course  I have reviewed the triage vital signs and the nursing notes.  Pertinent labs & imaging results that were available during my care of the patient were reviewed by me and considered in my medical decision making (see chart for details).    Wound culture obtained from R axilla. Expressed copious amounts of drainage. Since drainage was being expressed easily, did not widen opening of abscess. Applied Bacitracin ointment and covered with gauze. Discussed that he may need Surgical  consult since abscess may be tracking deeper and to prevent additional abscess formation. Recommend continue Bactrim as directed. Add Doxycycline 100mg  twice a day as directed. Continue to clean area with Hibiclens. Continue to apply warm compresses 3 to 4 times a day to help with drainage. Call [facility] today to schedule appointment ASAP for further management. Follow-up pending lab results. If abscess worsens over the weekend (more redness, swelling, pain), go to the ER. Otherwise follow up with the Surgeon as planned.   Final Clinical Impressions(s) / UC Diagnoses   Final diagnoses:  Abscess of axilla, right  Folliculitis  Elevated blood-pressure reading without diagnosis of hypertension     Discharge Instructions     Recommend add Doxycycline 100mg  twice a day as directed. Continue Bactrim as directed. Continue to apply warm compresses to area to help with drainage. Call [facility] group today to schedule appointment for further evaluation- may need additional I & D and deep packing. Follow-up with Surgeon as planned or go to the ER if pain, swelling worsens.     ED Prescriptions    Medication  Sig Dispense Auth. Provider   doxycycline (VIBRAMYCIN) 100 MG capsule Take 1 capsule (100 mg total) by mouth 2 (two) times daily for 10 days. 20 capsule Katy Apo, NP     Controlled Substance Prescriptions [place] Controlled Substance Registry consulted? Not Applicable   Katy Apo, NP 07/12/18 [phone]

## 2018-07-11 NOTE — ED Triage Notes (Signed)
Pt presents with abscess under right arm that has been previously lanced and given antibiotic for; area under arm seems to be infected and progressively getting worse.

## 2018-07-13 LAB — AEROBIC CULTURE W GRAM STAIN (SUPERFICIAL SPECIMEN): Special Requests: NORMAL

## 2018-07-13 LAB — AEROBIC CULTURE  (SUPERFICIAL SPECIMEN)

## 2018-07-14 ENCOUNTER — Telehealth (HOSPITAL_COMMUNITY): Payer: Self-pay

## 2018-07-14 NOTE — Telephone Encounter (Signed)
Culture positive for MRSA. Pt was treated with Doxycycline. Attempted to reach patient. No answer at this time.

## 2018-09-22 ENCOUNTER — Ambulatory Visit (HOSPITAL_COMMUNITY)
Admission: EM | Admit: 2018-09-22 | Discharge: 2018-09-22 | Disposition: A | Discharge status: Home / Self Care | Payer: BLUE CROSS/BLUE SHIELD | Attending: Family Medicine | Admitting: Family Medicine

## 2018-09-22 ENCOUNTER — Other Ambulatory Visit: Payer: Self-pay

## 2018-09-22 ENCOUNTER — Encounter (HOSPITAL_COMMUNITY): Payer: Self-pay | Admitting: Emergency Medicine

## 2018-09-22 DIAGNOSIS — L03114 Cellulitis of left upper limb: Secondary | ICD-10-CM | POA: Diagnosis not present

## 2018-09-22 HISTORY — DX: Carrier or suspected carrier of methicillin resistant Staphylococcus aureus: Z22.322

## 2018-09-22 MED ORDER — DOXYCYCLINE HYCLATE 100 MG PO CAPS: 100.0000 mg | ORAL_CAPSULE | Freq: Two times a day (BID) | ORAL | 0 refills | Status: DC

## 2018-09-22 MED ORDER — MUPIROCIN 2 % EX OINT: 1.0000 "application " | TOPICAL_OINTMENT | Freq: Two times a day (BID) | CUTANEOUS | 0 refills | Status: DC

## 2018-09-22 NOTE — ED Triage Notes (Signed)
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  He has a new place that developed on his left forearm about one month ago.

## 2018-09-22 NOTE — ED Provider Notes (Signed)
[place]    CSN: [id number] Arrival date & time: 09/22/18  1356     History   Chief Complaint Chief Complaint  Patient presents with  . mrsa    HPI Andrew Fuentes is a 62 y.o. male.   62 year old male comes in for 1 month history of skin infection. States that he will get redness, warmth, and swelling to the left arm. It will resolve on own, but another lesion can develop on another part of the arm. No spreading erythema. Denies fever, chills, night sweats. States no obvious pain. He denies injury/trauma. No changes in hygiene product. History of MRSA infection.      Past Medical History:  Diagnosis Date  . MRSA (methicillin resistant staph aureus) culture positive     Patient Active Problem List   Diagnosis Date Noted  . DYSPHAGIA UNSPECIFIED 12/19/2009  . GERD 11/22/2009  . ELEVATED BLOOD PRESSURE WITHOUT DIAGNOSIS OF HYPERTENSION 11/22/2009    History reviewed. No pertinent surgical history.     Home Medications    Prior to Admission medications   Medication Sig Start Date End Date Taking? Authorizing Provider  doxycycline (VIBRAMYCIN) 100 MG capsule Take 1 capsule (100 mg total) by mouth 2 (two) times daily. 09/22/18   Tasia Catchings, Jaquis Picklesimer V, PA-C  mupirocin ointment (BACTROBAN) 2 % Apply 1 application topically 2 (two) times daily. 09/22/18   Ok Edwards, PA-C    Family History History reviewed. No pertinent family history.  Social History Social History   Tobacco Use  . Smoking status: Never Smoker  . Smokeless tobacco: Never Used  Substance Use Topics  . Alcohol use: Never    Frequency: Never  . Drug use: Never     Allergies   Tape   Review of Systems Review of Systems  Reason unable to perform ROS: See HPI as above.     Physical Exam Triage Vital Signs ED Triage Vitals [09/22/18 1514]  Enc Vitals Group     BP (!) 161/82     Pulse Rate 71     Resp      Temp 98.3 F (36.8 C)     Temp Source Oral     SpO2 98 %     Weight    Height      Head Circumference      Peak Flow      Pain Score 2     Pain Loc      Pain Edu?      Excl. in [place]?    No data found.  Updated Vital Signs BP (!) 161/82 (BP Location: Left Arm)   Pulse 71   Temp 98.3 F (36.8 C) (Oral)   SpO2 98%   Physical Exam  Constitutional: He is oriented to person, place, and time. He appears well-developed and well-nourished. No distress.  HENT:  Head: Normocephalic and atraumatic.  Eyes: Pupils are equal, round, and reactive to light. Conjunctivae are normal.  Neurological: He is alert and oriented to person, place, and time.  Skin: He is not diaphoretic.  See picture below. Multiple folliculitis on the left arm. Picture shows largest with surrounding erythema and warmth. No fluctuance felt.          UC Treatments / Results  Labs (all labs ordered are listed, but only abnormal results are displayed) Labs Reviewed - No data to display  EKG None  Radiology No results found.  Procedures Procedures (including critical care time)  Medications Ordered in UC  Medications - No data to display  Initial Impression / Assessment and Plan / UC Course  I have reviewed the triage vital signs and the nursing notes.  Pertinent labs & imaging results that were available during my care of the patient were reviewed by me and considered in my medical decision making (see chart for details).    Patient with history of MRSA, resistant to Bactrim.  Will start doxycycline as directed.  Wound care instructions given.  Return precautions given.  Patient expresses understanding and agrees to plan.  Final Clinical Impressions(s) / UC Diagnoses   Final diagnoses:  Cellulitis of left upper extremity    ED Prescriptions    Medication Sig Dispense Auth. Provider   doxycycline (VIBRAMYCIN) 100 MG capsule Take 1 capsule (100 mg total) by mouth 2 (two) times daily. 20 capsule Tasia Catchings, Morayo Leven V, PA-C   mupirocin ointment (BACTROBAN) 2 % Apply 1 application  topically 2 (two) times daily. 22 g Tobin Chad, [place] 09/22/18 [phone]

## 2018-09-22 NOTE — Discharge Instructions (Signed)
Start doxycycline as directed. Bactroban to dress the wound. Gently clean with soap and water daily. Monitor for spreading redness, increased warmth, fever, follow up for reevaluation.

## 2019-02-18 ENCOUNTER — Other Ambulatory Visit: Payer: Self-pay

## 2019-02-18 ENCOUNTER — Ambulatory Visit (HOSPITAL_COMMUNITY)
Admission: EM | Admit: 2019-02-18 | Discharge: 2019-02-18 | Disposition: A | Discharge status: Home / Self Care | Payer: BLUE CROSS/BLUE SHIELD | Attending: Family Medicine | Admitting: Family Medicine

## 2019-02-18 ENCOUNTER — Encounter (HOSPITAL_COMMUNITY): Payer: Self-pay

## 2019-02-18 DIAGNOSIS — L03211 Cellulitis of face: Secondary | ICD-10-CM | POA: Diagnosis not present

## 2019-02-18 MED ORDER — DOXYCYCLINE HYCLATE 100 MG PO CAPS: 100.0000 mg | ORAL_CAPSULE | Freq: Two times a day (BID) | ORAL | 0 refills | Status: DC

## 2019-02-18 NOTE — ED Triage Notes (Signed)
Patient presents to Urgent Care with complaints of nose pain and swelling since a week ago. Patient states it has gotten progressively worse and is worse on the left side.

## 2019-02-18 NOTE — ED Notes (Signed)
Patient verbalizes understanding of discharge instructions. Opportunity for questioning and answers were provided. Patient discharged from [facility] by MD. 

## 2019-02-25 NOTE — ED Provider Notes (Signed)
[place]   [id number] 02/18/19 Arrival Time: 1252  ASSESSMENT & PLAN:  1. Facial cellulitis    Meds ordered this encounter  Medications  . doxycycline (VIBRAMYCIN) 100 MG capsule    Sig: Take 1 capsule (100 mg total) by mouth 2 (two) times daily.    Dispense:  20 capsule    Refill:  0   Will f/u with PCP or here if not seeing improvement over the next 48 hours. ED if rapidly worsening. OTC analgesics if needed.  Reviewed expectations re: course of current medical issues. Questions answered. Outlined signs and symptoms indicating need for more acute intervention. Patient verbalized understanding. After Visit Summary given.   SUBJECTIVE:  Andrew Fuentes is a 63 y.o. male who presents with nose pain and swelling.  Onset: gradual Duration: over the past several days Associated pruritis? none Associated pain? mild/moderate Progression: increasing slowly Drainage? No  Known trigger? No  New soaps/lotions/topicals/detergents/environmental exposures? No Contacts with similar? No Recent travel? No  Other associated symptoms: none Therapies tried thus far: none Arthralgia or myalgia? none Recent illness? none Fever? none No specific aggravating or alleviating factors reported. Reports h/o MRSA skin infections.  ROS: As per HPI.  OBJECTIVE: Vitals:   02/18/19 1306  BP: (!) 164/89  Pulse: 61  Resp: 18  Temp: 98.1 F (36.7 C)  TempSrc: Oral  SpO2: 100%    General appearance: alert; no distress Lungs: clear to auscultation bilaterally Heart: regular rate and rhythm Extremities: no edema Skin: warm and dry; erythema over nose and extending slightly to cheeks; warm to touch; no fluctuance; no open wounds; no vesicles Psychological: alert and cooperative; normal mood and affect  Allergies  Allergen Reactions  . Tape     Past Medical History:  Diagnosis Date  . MRSA (methicillin resistant staph aureus) culture positive    Social History    Socioeconomic History  . Marital status: Married    Spouse name: Not on file  . Number of children: Not on file  . Years of education: Not on file  . Highest education level: Not on file  Occupational History  . Not on file  Social Needs  . Financial resource strain: Not on file  . Food insecurity:    Worry: Not on file    Inability: Not on file  . Transportation needs:    Medical: Not on file    Non-medical: Not on file  Tobacco Use  . Smoking status: Never Smoker  . Smokeless tobacco: Never Used  Substance and Sexual Activity  . Alcohol use: Never    Frequency: Never  . Drug use: Never  . Sexual activity: Not on file  Lifestyle  . Physical activity:    Days per week: Not on file    Minutes per session: Not on file  . Stress: Not on file  Relationships  . Social connections:    Talks on phone: Not on file    Gets together: Not on file    Attends religious service: Not on file    Active member of club or organization: Not on file    Attends meetings of clubs or organizations: Not on file    Relationship status: Not on file  . Intimate partner violence:    Fear of current or ex partner: Not on file    Emotionally abused: Not on file    Physically abused: Not on file    Forced sexual activity: Not on file  Other Topics Concern  .  Not on file  Social History Narrative  . Not on file    History reviewed. No pertinent surgical history.   Vanessa Kick, MD 02/25/19 [phone]

## 2020-09-23 NOTE — Patient Instructions (Signed)
Thank you for choosing Primary Care at [facility] to be your medical home!    Andrew Fuentes was seen by Melina Schools, DO today.   Andrew Fuentes's primary care provider is Phill Myron, DO.   For the best care possible, you should try to see Phill Myron, DO whenever you come to the clinic.   We look forward to seeing you again soon!  If you have any questions about your visit today, please call us at [phone] or feel free to reach your primary care provider via [place].

## 2020-09-26 ENCOUNTER — Telehealth (INDEPENDENT_AMBULATORY_CARE_PROVIDER_SITE_OTHER): Payer: BC Managed Care – PPO | Admitting: Internal Medicine

## 2020-09-26 ENCOUNTER — Encounter: Payer: Self-pay | Admitting: Internal Medicine

## 2020-09-26 ENCOUNTER — Other Ambulatory Visit: Payer: Self-pay

## 2020-09-26 DIAGNOSIS — R079 Chest pain, unspecified: Secondary | ICD-10-CM

## 2020-09-26 DIAGNOSIS — Z7689 Persons encountering health services in other specified circumstances: Secondary | ICD-10-CM

## 2020-09-26 DIAGNOSIS — Z1211 Encounter for screening for malignant neoplasm of colon: Secondary | ICD-10-CM

## 2020-09-26 MED ORDER — PANTOPRAZOLE SODIUM 40 MG PO TBEC: 40.0000 mg | DELAYED_RELEASE_TABLET | Freq: Every day | ORAL | 2 refills | Status: DC

## 2020-09-26 NOTE — Progress Notes (Signed)
Virtual Visit via Telephone Note  I connected with Andrew Fuentes, on 09/26/2020 at 10:23 AM by telephone due to the COVID-19 pandemic and verified that I am speaking with the correct person using two identifiers.   Consent: I discussed the limitations, risks, security and privacy concerns of performing an evaluation and management service by telephone and the availability of in person appointments. I also discussed with the patient that there may be a patient responsible charge related to this service. The patient expressed understanding and agreed to proceed.   Location of Patient: Home   Location of Provider: Clinic    Persons participating in Telemedicine visit: KAIZER DISSINGER [facility] Dr. Juleen China      History of Present Illness: Patient has a visit to establish care--has not had a primary care physician in the past several years. Patient denies significant PMH.    Patient reports that he has been having chest pain directly in the center of his chest for one month. Not continuous. Does not radiate. Reports occurs with strenuous physical activity but then resolves with rest. Does also report that there seems to be a relationship with foods especially if he eats certain types of food or eats a lot of food. Is unable to specify what type of foods. Patient denies associated symptoms such as SOB, DOE, nausea/vomiting, arm pain/numbness, jaw claudication. Patient reports that he has been told he has borderline HTN but has never taken medications. No history of MI or CVA. No history of CHF. Not a smoker. Has never seen a cardiologist prior.     Past Medical History:  Diagnosis Date  . MRSA (methicillin resistant staph aureus) culture positive    Allergies  Allergen Reactions  . Tape     No current outpatient medications on file prior to visit.   No current facility-administered medications on file prior to visit.    Observations/Objective: NAD. Speaking  clearly.  Work of breathing normal.  Alert and oriented. Mood appropriate.   Assessment and Plan: 1. Encounter to establish care Reviewed patient's PMH, social history, surgical history, and medications.  Is overdue for annual exam, screening blood work, and health maintenance topics. Have asked patient to return for visit to address these items.   2. Colon cancer screening Reviewed that patient had colonoscopy with sessile polyps in cecum and ascending colon. Path showed tubular adenomas without high grade dysplasia or maligancy with recommended 5 year f/u. Patient apparently lost to f/u. Referral for repeat colonscopy placed.    - Ambulatory referral to Gastroenterology  3. Chest pain, unspecified type Differential remains broad. Given food association, may be related to uncontrolled dyspepsia or GERD. Start trial of PPI. However, given report of occurring with exertion and improving with rest, am concerned for anginal type pain. Will refer to cardiology for further work up. Patient stable for urgent outpatient f/u given that pain is not continuous and self resolving at present. Has risk factors of obesity and apparent borderline HTN. No other cardiac history per patient report.  - pantoprazole (PROTONIX) 40 MG tablet; Take 1 tablet (40 mg total) by mouth daily.  Dispense: 30 tablet; Refill: 2 - Ambulatory referral to Cardiology   Follow Up Instructions: Annual exam; referrals as above    I discussed the assessment and treatment plan with the patient. The patient was provided an opportunity to ask questions and all were answered. The patient agreed with the plan and demonstrated an understanding of the instructions.   The patient was advised  to call back or seek an in-person evaluation if the symptoms worsen or if the condition fails to improve as anticipated.     I provided 18 minutes total of non-face-to-face time during this encounter including median intraservice time, reviewing  previous notes, investigations, ordering medications, medical decision making, coordinating care and patient verbalized understanding at the end of the visit.    Phill Myron, D.O. [facility]  09/26/2020, 10:23 AM

## 2020-10-02 NOTE — H&P (View-Only) (Signed)
Cardiology Office Note:    Date:  10/03/2020   ID:  Andrew Fuentes, DOB [birth year], MRN [id number]  PCP:  Patient, No Pcp Per  [facility] Cardiologist:  Andrew Bergeron, MD  [facility] Electrophysiologist:  None   Referring MD: Andrew Fuentes*     History of Present Illness:    Andrew Fuentes is a 64 y.o. male with no significant past medical history who was referred by Andrew Fuentes for further evaluation of exertional chest pain.  Patient reported to his PCP that he has been having intermittent chest pain that has been ongoing for the past several months. Pain is located in the center of his chest and does not radiated. Occurs with strenuous activity and resolves with rest.  No associated SOB, n/v, diaphoresis, jaw pain or DOE. States the pain can differ in quality and duration but is always exertional. No known cardiac history. Only medication is PPI. Father has history of MI at age 75. No other known family history of CAD.  TC 250, LDL 182, HDL 51, TG 92.   Past Medical History:  Diagnosis Date  . MRSA (methicillin resistant staph aureus) culture positive     No past surgical history on file.  Current Medications: Current Meds  Medication Sig  . pantoprazole (PROTONIX) 40 MG tablet Take 1 tablet (40 mg total) by mouth daily.     Allergies:   Tape   Social History   Socioeconomic History  . Marital status: Married    Spouse name: Not on file  . Number of children: Not on file  . Years of education: Not on file  . Highest education level: Not on file  Occupational History  . Not on file  Tobacco Use  . Smoking status: Fuentes Smoker  . Smokeless tobacco: Fuentes Used  Vaping Use  . Vaping Use: Fuentes used  Substance and Sexual Activity  . Alcohol use: Fuentes  . Drug use: Fuentes  . Sexual activity: Not on file  Other Topics Concern  . Not on file  Social History Narrative  . Not on file   Social Determinants of Health   Financial  Resource Strain: Not on file  Food Insecurity: Not on file  Transportation Needs: Not on file  Physical Activity: Not on file  Stress: Not on file  Social Connections: Not on file     Family History: Father with MI at age 72.  ROS:   Please see the history of present illness.    Review of Systems  Constitutional: Negative for chills and fever.  HENT: Negative for congestion.   Eyes: Negative for blurred vision.  Respiratory: Negative for shortness of breath.   Cardiovascular: Positive for chest pain. Negative for palpitations, orthopnea, claudication, leg swelling and PND.  Gastrointestinal: Negative for nausea and vomiting.  Genitourinary: Negative for hematuria.  Musculoskeletal: Positive for joint pain.  Neurological: Negative for dizziness and loss of consciousness.  Endo/Heme/Allergies: Negative for polydipsia.  Psychiatric/Behavioral: Negative for substance abuse.    EKGs/Labs/Other Studies Reviewed:    The following studies were reviewed today: CTA with abdomen/pelvis: Findings: Intact abdominal aorta. Mild tortuosity without  significant atherosclerosis. Negative for aneurysm, dissection or  occlusive disease. Celiac, SMA, and IMA are all patent off of the  aorta. No mesenteric occlusive vascular disease centrally. Single  renal arteries are identified and widely patent. No proximal or  ostial renal artery stenosis. No evidence of mesenteric vascular  aneurysm by CTA. Splenic artery is noted to  be tortuous as it  courses posterior to the proximal stomach which may account for the  endoscopic findings. Otherwise, the stomach wall appears uniform.  No vascular abnormality demonstrated within the wall of stomach.  Stomach is under distended. No significant hernia.    Additional axial imaging: Minimal hypoventilatory changes of the  lower lobes. Normal heart size. No pericardial or pleural  effusion.    Calcified splenic granulomata are noted.  Liver, gallbladder,  biliary system, adrenal glands, and pancreas within normal limits  for age. Right kidney demonstrates cortical low density cystic  lesions consistent with renal cysts, largest measures 2 cm in the  lower pole. The left kidney demonstrates a nonobstructing midpole  7 mm calculus, image 49. Normal symmetric renal enhancement and  excretion. No bowel obstruction pattern, dilatation, or ileus.  Retained stool throughout the colon. No abdominal acute  inflammation, fluid collection, free fluid, abscess, or hemorrhage.  Bilateral L4 pars defects are noted with minimal anterior listhesis  and moderately severe degenerative change at this level as well.    Review of the MIP images confirms the above findings.    IMPRESSION:  No evidence of intra abdominal mesenteric aneurysm.    Prominent tortuous splenic artery courses posterior to the stomach  and may account for the described endoscopic findings.    No evidence of mesenteric or renal vascular disease.    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   EKG:  EKG is  ordered today.  The ekg ordered today demonstrates NSR with LVH. HR 61.  Recent Labs: No results found for requested labs within last 8760 hours.  Recent Lipid Panel    Component Value Date/Time   CHOL 250 (H) 11/22/2009 1539   TRIG 92.0 11/22/2009 1539   HDL 51.80 11/22/2009 1539   CHOLHDL 5 11/22/2009 1539   VLDL 18.4 11/22/2009 1539   LDLDIRECT 182.4 11/22/2009 1539      Physical Exam:    VS:  BP (!) 140/96   Pulse 61   Ht 6' (1.829 m)   Wt 236 lb (107 kg)   SpO2 95%   BMI 32.01 kg/m     Wt Readings from Last 3 Encounters:  10/03/20 236 lb (107 kg)     GEN:  Well nourished, well developed in no acute distress HEENT: Normal NECK: No JVD; No carotid bruits CARDIAC: RRR, no murmurs, rubs,  gallops RESPIRATORY:  Clear to auscultation without rales, wheezing or rhonchi  ABDOMEN: Soft, non-tender, non-distended MUSCULOSKELETAL:  No edema; No deformity  SKIN: Warm and dry NEUROLOGIC:  Alert and oriented x 3 PSYCHIATRIC:  Normal affect   ASSESSMENT:    1. Stable angina ([place])   2. Primary hypertension   3. Pure hypercholesterolemia    PLAN:    In order of problems listed above:  #Stable Angina: Patient presents with exertional chest pain that has been ongoing for the past year but worsened over the past several months. No associated SOB, palpitations, n/v, or diaphoresis. States quality of the chest pain can vary but always occurs with exertion. Family history notable for MI in his father at age 100. No other known family or personal history of CAD. Patient states he does not have HTN or known HLD, however, likely these are undiagnosed as ECG with LVH and LDL 182. -Plan for coronary angiography -Start ASA 81mg  daily -Start crestor 20mg  daily -Start  metop 25mg  XL -Start losartan 25mg  daily -Obtain TTE -Repeat BMET in 1 week while on losartan  #HLD: LDL 182. Not on treatment. -Start crestor 20mg  as above -Repeat cholesterol panel in 6 weeks  #HTN: Blood pressure elevated in office today. -Start metop and losartan as above   Shared Decision Making/Informed Consent{  The risks [stroke (1 in 1000), death (1 in 1000), kidney failure [usually temporary] (1 in 500), bleeding (1 in 200), allergic reaction [possibly serious] (1 in 200)], benefits (diagnostic support and management of coronary artery disease) and alternatives of a cardiac catheterization were discussed in detail with Andrew Fuentes and he is willing to proceed.  Medication Adjustments/Labs and Tests Ordered: Current medicines are reviewed at length with the patient today.  Concerns regarding medicines are outlined above.  Orders Placed This Encounter  Procedures  . Basic Metabolic Panel (BMET)  . Basic  Metabolic Panel (BMET)  . CBC  . EKG 12-Lead  . ECHOCARDIOGRAM COMPLETE   Meds ordered this encounter  Medications  . aspirin EC 81 MG tablet    Sig: Take 1 tablet (81 mg total) by mouth daily. Swallow whole.    Dispense:  90 tablet    Refill:  3  . metoprolol succinate (TOPROL XL) 25 MG 24 hr tablet    Sig: Take 1 tablet (25 mg total) by mouth daily.    Dispense:  90 tablet    Refill:  3  . losartan (COZAAR) 25 MG tablet    Sig: Take 1 tablet (25 mg total) by mouth daily.    Dispense:  90 tablet    Refill:  3  . rosuvastatin (CRESTOR) 20 MG tablet    Sig: Take 1 tablet (20 mg total) by mouth daily.    Dispense:  90 tablet    Refill:  3    Patient Instructions  Medication Instructions:  Your physician has recommended you make the following change in your medication: Start Aspirin 81 mg by mouth daily. Start Losartan 25 mg by mouth daily. Start Metoprolol Succinate 25 mg by mouth daily. Start Rosuvastatin 20 mg by mouth daily at bedtime  *If you need a refill on your cardiac medications before your next appointment, please call your pharmacy*   Lab Work: Lab work to be done today--BMP and CBC Your physician recommends that you return for lab work on 10/11/20--BMP  If you have labs (blood work) drawn today and your tests are completely normal, you will receive your results only by: Marland Kitchen MyChart Message (if you have MyChart) OR . A paper copy in the mail If you have any lab test that is abnormal or we need to change your treatment, we will call you to review the results.   Testing/Procedures: Your physician has requested that you have a cardiac catheterization. Cardiac catheterization is used to diagnose and/or treat various heart conditions. Doctors may recommend this procedure for a number of different reasons. The most common reason is to evaluate chest pain. Chest pain can be a symptom of coronary artery disease (CAD), and cardiac catheterization can show whether plaque is  narrowing or blocking your heart's arteries. This procedure is also used to evaluate the valves, as well as measure the blood flow and oxygen levels in different parts of your heart. For further information please visit [email or web address]. Please follow instruction sheet, as given.  Scheduled for 10/10/20  Your physician has requested that you have an echocardiogram. Echocardiography is a painless test that uses sound waves to create images of  your heart. It provides your doctor with information about the size and shape of your heart and how well your heart's chambers and valves are working. This procedure takes approximately one hour. There are no restrictions for this procedure.    Follow-Up: At [facility], you and your health needs are our priority.  As part of our continuing mission to provide you with exceptional heart care, we have created designated Provider Care Teams.  These Care Teams include your primary Cardiologist (physician) and Advanced Practice Providers (APPs -  Physician Assistants and Nurse Practitioners) who all work together to provide you with the care you need, when you need it.  We recommend signing up for the patient portal called "MyChart".  Sign up information is provided on this After Visit Summary.  MyChart is used to connect with patients for Virtual Visits (Telemedicine).  Patients are able to view lab/test results, encounter notes, upcoming appointments, etc.  Non-urgent messages can be sent to your provider as well.   To learn more about what you can do with MyChart, go to [email or web address].    Your next appointment:   To be arranged after procedure  The format for your next appointment:   In Person  Provider:   You may see Andrew Bergeron, MD or one of the following Advanced Practice Providers on your designated Care Team:    Richardson Dopp, PA-C  Robbie Lis, [place]    Other Instructions      [place] OFFICE [place], [place] [address] Dept: [phone] Loc: [facility]  10/03/2020  You are scheduled for a Cardiac Catheterization on Monday, December 20 with Dr. Larae Grooms.  1. Please arrive at the [facility] (Main Entrance A) at [facility]: [address] [place], [place] [address] at 7:00 AM (This time is two hours before your procedure to ensure your preparation). Free valet parking service is available.   Special note: Every effort is made to have your procedure done on time. Please understand that emergencies sometimes delay scheduled procedures.  2. Diet: Do not eat solid foods after midnight.  The patient may have clear liquids until 5am upon the day of the procedure.  3. Labs: Lab work done in office on 10/03/20. You will also need lab work done on 10/11/20  4. Medication instructions in preparation for your procedure:   Contrast Allergy: No On the morning of your procedure, take your Aspirin and any morning medicines NOT listed above.  You may use sips of water.  5. Plan for one night stay--bring personal belongings. 6. Bring a current list of your medications and current insurance cards. 7. You MUST have a responsible person to drive you home. 8. Someone MUST be with you the first 24 hours after you arrive home or your discharge will be delayed. 9. Please wear clothes that are easy to get on and off and wear slip-on shoes.  Thank you for allowing [place] to care for you!   -- [facility]  Due to recent COVID-19 restrictions implemented by our local and state authorities and in an effort to keep both patients and staff as safe as possible, our hospital system requires COVID-19 testing prior to certain scheduled hospital procedures.  Please go to [place] [place], [place] [address] on December 18,2021 at 11:55 AM  .  This is a  drive up testing  site.  You will not need to exit your vehicle.  You will not be billed at the time of testing but may receive a bill later depending on your insurance. You must agree to self-quarantine from the time of your testing until the procedure date on December 20,2021  This should included staying home with ONLY the people you live with.  Avoid take-out, grocery store shopping or leaving the house for any non-emergent reason.  Failure to have your COVID-19 test done on the date and time you have been scheduled will result in cancellation of your procedure.  Please call our office at [phone] if you have any questions.     Signed, Andrew Bergeron, MD  10/03/2020 3:44 PM    [place]

## 2020-10-02 NOTE — Progress Notes (Signed)
Cardiology Office Note:    Date:  10/03/2020   ID:  Andrew Fuentes, DOB 30-Jul-1956, MRN [id number]  PCP:  Patient, No Pcp Per  [facility] Cardiologist:  Freada Bergeron, MD  [facility] Electrophysiologist:  None   Referring MD: Caryl Never*     History of Present Illness:    Andrew Fuentes is a 64 y.o. male with no significant past medical history who was referred by Phill Myron for further evaluation of exertional chest pain.  Patient reported to his PCP that he has been having intermittent chest pain that has been ongoing for the past several months. Pain is located in the center of his chest and does not radiated. Occurs with strenuous activity and resolves with rest.  No associated SOB, n/v, diaphoresis, jaw pain or DOE. States the pain can differ in quality and duration but is always exertional. No known cardiac history. Only medication is PPI. Father has history of MI at age 2. No other known family history of CAD.  TC 250, LDL 182, HDL 51, TG 92.   Past Medical History:  Diagnosis Date  . MRSA (methicillin resistant staph aureus) culture positive     No past surgical history on file.  Current Medications: Current Meds  Medication Sig  . pantoprazole (PROTONIX) 40 MG tablet Take 1 tablet (40 mg total) by mouth daily.     Allergies:   Tape   Social History   Socioeconomic History  . Marital status: Married    Spouse name: Not on file  . Number of children: Not on file  . Years of education: Not on file  . Highest education level: Not on file  Occupational History  . Not on file  Tobacco Use  . Smoking status: Never Smoker  . Smokeless tobacco: Never Used  Vaping Use  . Vaping Use: Never used  Substance and Sexual Activity  . Alcohol use: Never  . Drug use: Never  . Sexual activity: Not on file  Other Topics Concern  . Not on file  Social History Narrative  . Not on file   Social Determinants of Health   Financial  Resource Strain: Not on file  Food Insecurity: Not on file  Transportation Needs: Not on file  Physical Activity: Not on file  Stress: Not on file  Social Connections: Not on file     Family History: Father with MI at age 29.  ROS:   Please see the history of present illness.    Review of Systems  Constitutional: Negative for chills and fever.  HENT: Negative for congestion.   Eyes: Negative for blurred vision.  Respiratory: Negative for shortness of breath.   Cardiovascular: Positive for chest pain. Negative for palpitations, orthopnea, claudication, leg swelling and PND.  Gastrointestinal: Negative for nausea and vomiting.  Genitourinary: Negative for hematuria.  Musculoskeletal: Positive for joint pain.  Neurological: Negative for dizziness and loss of consciousness.  Endo/Heme/Allergies: Negative for polydipsia.  Psychiatric/Behavioral: Negative for substance abuse.    EKGs/Labs/Other Studies Reviewed:    The following studies were reviewed today: CTA with abdomen/pelvis: Findings: Intact abdominal aorta. Mild tortuosity without  significant atherosclerosis. Negative for aneurysm, dissection or  occlusive disease. Celiac, SMA, and IMA are all patent off of the  aorta. No mesenteric occlusive vascular disease centrally. Single  renal arteries are identified and widely patent. No proximal or  ostial renal artery stenosis. No evidence of mesenteric vascular  aneurysm by CTA. Splenic artery is noted to  be tortuous as it  courses posterior to the proximal stomach which may account for the  endoscopic findings. Otherwise, the stomach wall appears uniform.  No vascular abnormality demonstrated within the wall of stomach.  Stomach is under distended. No significant hernia.    Additional axial imaging: Minimal hypoventilatory changes of the  lower lobes. Normal heart size. No pericardial or pleural  effusion.    Calcified splenic granulomata are noted.  Liver, gallbladder,  biliary system, adrenal glands, and pancreas within normal limits  for age. Right kidney demonstrates cortical low density cystic  lesions consistent with renal cysts, largest measures 2 cm in the  lower pole. The left kidney demonstrates a nonobstructing midpole  7 mm calculus, image 49. Normal symmetric renal enhancement and  excretion. No bowel obstruction pattern, dilatation, or ileus.  Retained stool throughout the colon. No abdominal acute  inflammation, fluid collection, free fluid, abscess, or hemorrhage.  Bilateral L4 pars defects are noted with minimal anterior listhesis  and moderately severe degenerative change at this level as well.    Review of the MIP images confirms the above findings.    IMPRESSION:  No evidence of intra abdominal mesenteric aneurysm.    Prominent tortuous splenic artery courses posterior to the stomach  and may account for the described endoscopic findings.    No evidence of mesenteric or renal vascular disease.    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   EKG:  EKG is  ordered today.  The ekg ordered today demonstrates NSR with LVH. HR 61.  Recent Labs: No results found for requested labs within last 8760 hours.  Recent Lipid Panel    Component Value Date/Time   CHOL 250 (H) 11/22/2009 1539   TRIG 92.0 11/22/2009 1539   HDL 51.80 11/22/2009 1539   CHOLHDL 5 11/22/2009 1539   VLDL 18.4 11/22/2009 1539   LDLDIRECT 182.4 11/22/2009 1539      Physical Exam:    VS:  BP (!) 140/96   Pulse 61   Ht 6' (1.829 m)   Wt 236 lb (107 kg)   SpO2 95%   BMI 32.01 kg/m     Wt Readings from Last 3 Encounters:  10/03/20 236 lb (107 kg)     GEN:  Well nourished, well developed in no acute distress HEENT: Normal NECK: No JVD; No carotid bruits CARDIAC: RRR, no murmurs, rubs,  gallops RESPIRATORY:  Clear to auscultation without rales, wheezing or rhonchi  ABDOMEN: Soft, non-tender, non-distended MUSCULOSKELETAL:  No edema; No deformity  SKIN: Warm and dry NEUROLOGIC:  Alert and oriented x 3 PSYCHIATRIC:  Normal affect   ASSESSMENT:    1. Stable angina ([place])   2. Primary hypertension   3. Pure hypercholesterolemia    PLAN:    In order of problems listed above:  #Stable Angina: Patient presents with exertional chest pain that has been ongoing for the past year but worsened over the past several months. No associated SOB, palpitations, n/v, or diaphoresis. States quality of the chest pain can vary but always occurs with exertion. Family history notable for MI in his father at age 14. No other known family or personal history of CAD. Patient states he does not have HTN or known HLD, however, likely these are undiagnosed as ECG with LVH and LDL 182. -Plan for coronary angiography -Start ASA 81mg  daily -Start crestor 20mg  daily -Start  metop 25mg  XL -Start losartan 25mg  daily -Obtain TTE -Repeat BMET in 1 week while on losartan  #HLD: LDL 182. Not on treatment. -Start crestor 20mg  as above -Repeat cholesterol panel in 6 weeks  #HTN: Blood pressure elevated in office today. -Start metop and losartan as above   Shared Decision Making/Informed Consent{  The risks [stroke (1 in 1000), death (1 in 1000), kidney failure [usually temporary] (1 in 500), bleeding (1 in 200), allergic reaction [possibly serious] (1 in 200)], benefits (diagnostic support and management of coronary artery disease) and alternatives of a cardiac catheterization were discussed in detail with Mr. Cranshaw and he is willing to proceed.  Medication Adjustments/Labs and Tests Ordered: Current medicines are reviewed at length with the patient today.  Concerns regarding medicines are outlined above.  Orders Placed This Encounter  Procedures  . Basic Metabolic Panel (BMET)  . Basic  Metabolic Panel (BMET)  . CBC  . EKG 12-Lead  . ECHOCARDIOGRAM COMPLETE   Meds ordered this encounter  Medications  . aspirin EC 81 MG tablet    Sig: Take 1 tablet (81 mg total) by mouth daily. Swallow whole.    Dispense:  90 tablet    Refill:  3  . metoprolol succinate (TOPROL XL) 25 MG 24 hr tablet    Sig: Take 1 tablet (25 mg total) by mouth daily.    Dispense:  90 tablet    Refill:  3  . losartan (COZAAR) 25 MG tablet    Sig: Take 1 tablet (25 mg total) by mouth daily.    Dispense:  90 tablet    Refill:  3  . rosuvastatin (CRESTOR) 20 MG tablet    Sig: Take 1 tablet (20 mg total) by mouth daily.    Dispense:  90 tablet    Refill:  3    Patient Instructions  Medication Instructions:  Your physician has recommended you make the following change in your medication: Start Aspirin 81 mg by mouth daily. Start Losartan 25 mg by mouth daily. Start Metoprolol Succinate 25 mg by mouth daily. Start Rosuvastatin 20 mg by mouth daily at bedtime  *If you need a refill on your cardiac medications before your next appointment, please call your pharmacy*   Lab Work: Lab work to be done today--BMP and CBC Your physician recommends that you return for lab work on 10/11/20--BMP  If you have labs (blood work) drawn today and your tests are completely normal, you will receive your results only by: Marland Kitchen MyChart Message (if you have MyChart) OR . A paper copy in the mail If you have any lab test that is abnormal or we need to change your treatment, we will call you to review the results.   Testing/Procedures: Your physician has requested that you have a cardiac catheterization. Cardiac catheterization is used to diagnose and/or treat various heart conditions. Doctors may recommend this procedure for a number of different reasons. The most common reason is to evaluate chest pain. Chest pain can be a symptom of coronary artery disease (CAD), and cardiac catheterization can show whether plaque is  narrowing or blocking your heart's arteries. This procedure is also used to evaluate the valves, as well as measure the blood flow and oxygen levels in different parts of your heart. For further information please visit HugeFiesta.[email or web address]. Please follow instruction sheet, as given.  Scheduled for 10/10/20  Your physician has requested that you have an echocardiogram. Echocardiography is a painless test that uses sound waves to create images of  your heart. It provides your doctor with information about the size and shape of your heart and how well your heart's chambers and valves are working. This procedure takes approximately one hour. There are no restrictions for this procedure.    Follow-Up: At [facility], you and your health needs are our priority.  As part of our continuing mission to provide you with exceptional heart care, we have created designated Provider Care Teams.  These Care Teams include your primary Cardiologist (physician) and Advanced Practice Providers (APPs -  Physician Assistants and Nurse Practitioners) who all work together to provide you with the care you need, when you need it.  We recommend signing up for the patient portal called "MyChart".  Sign up information is provided on this After Visit Summary.  MyChart is used to connect with patients for Virtual Visits (Telemedicine).  Patients are able to view lab/test results, encounter notes, upcoming appointments, etc.  Non-urgent messages can be sent to your provider as well.   To learn more about what you can do with MyChart, go to [email or web address].    Your next appointment:   To be arranged after procedure  The format for your next appointment:   In Person  Provider:   You may see Freada Bergeron, MD or one of the following Advanced Practice Providers on your designated Care Team:    Richardson Dopp, PA-C  Robbie Lis, [place]    Other Instructions      [place] OFFICE [place], [place] [address] Dept: [phone] Loc: [place]  10/03/2020  You are scheduled for a Cardiac Catheterization on Monday, December 20 with Dr. Larae Grooms.  1. Please arrive at the [facility] (Main Entrance A) at [facility]: [address] [place], [place] [address] at 7:00 AM (This time is two hours before your procedure to ensure your preparation). Free valet parking service is available.   Special note: Every effort is made to have your procedure done on time. Please understand that emergencies sometimes delay scheduled procedures.  2. Diet: Do not eat solid foods after midnight.  The patient may have clear liquids until 5am upon the day of the procedure.  3. Labs: Lab work done in office on 10/03/20. You will also need lab work done on 10/11/20  4. Medication instructions in preparation for your procedure:   Contrast Allergy: No On the morning of your procedure, take your Aspirin and any morning medicines NOT listed above.  You may use sips of water.  5. Plan for one night stay--bring personal belongings. 6. Bring a current list of your medications and current insurance cards. 7. You MUST have a responsible person to drive you home. 8. Someone MUST be with you the first 24 hours after you arrive home or your discharge will be delayed. 9. Please wear clothes that are easy to get on and off and wear slip-on shoes.  Thank you for allowing [place] to care for you!   -- [place] [facility]  Due to recent COVID-19 restrictions implemented by our local and state authorities and in an effort to keep both patients and staff as safe as possible, our hospital system requires COVID-19 testing prior to certain scheduled hospital procedures.  Please go to [place]. [place], [place] [address] on December 18,2021 at 11:55 AM  .  This is a  drive up testing  site.  You will not need to exit your vehicle.  You will not be billed at the time of testing but may receive a bill later depending on your insurance. You must agree to self-quarantine from the time of your testing until the procedure date on December 20,2021  This should included staying home with ONLY the people you live with.  Avoid take-out, grocery store shopping or leaving the house for any non-emergent reason.  Failure to have your COVID-19 test done on the date and time you have been scheduled will result in cancellation of your procedure.  Please call our office at [phone] if you have any questions.     Signed, Freada Bergeron, MD  10/03/2020 3:44 PM    [place]

## 2020-10-03 ENCOUNTER — Encounter: Payer: Self-pay | Admitting: Cardiology

## 2020-10-03 ENCOUNTER — Other Ambulatory Visit: Payer: Self-pay

## 2020-10-03 ENCOUNTER — Ambulatory Visit (INDEPENDENT_AMBULATORY_CARE_PROVIDER_SITE_OTHER): Payer: BC Managed Care – PPO | Admitting: Cardiology

## 2020-10-03 VITALS — BP 140/96 | HR 61 | Ht 72.0 in | Wt 236.0 lb

## 2020-10-03 DIAGNOSIS — E7800 Pure hypercholesterolemia, unspecified: Secondary | ICD-10-CM

## 2020-10-03 DIAGNOSIS — I10 Essential (primary) hypertension: Secondary | ICD-10-CM | POA: Diagnosis not present

## 2020-10-03 DIAGNOSIS — I208 Other forms of angina pectoris: Secondary | ICD-10-CM

## 2020-10-03 DIAGNOSIS — I2089 Other forms of angina pectoris: Secondary | ICD-10-CM

## 2020-10-03 MED ORDER — ASPIRIN EC 81 MG PO TBEC: 81.0000 mg | DELAYED_RELEASE_TABLET | Freq: Every day | ORAL | 3 refills | Status: AC

## 2020-10-03 MED ORDER — ROSUVASTATIN CALCIUM 20 MG PO TABS: 20.0000 mg | ORAL_TABLET | Freq: Every day | ORAL | 3 refills | Status: DC

## 2020-10-03 MED ORDER — METOPROLOL SUCCINATE ER 25 MG PO TB24: 25.0000 mg | ORAL_TABLET | Freq: Every day | ORAL | 3 refills | Status: DC

## 2020-10-03 MED ORDER — LOSARTAN POTASSIUM 25 MG PO TABS: 25.0000 mg | ORAL_TABLET | Freq: Every day | ORAL | 3 refills | Status: DC

## 2020-10-03 NOTE — Patient Instructions (Addendum)
Medication Instructions:  Your physician has recommended you make the following change in your medication: Start Aspirin 81 mg by mouth daily. Start Losartan 25 mg by mouth daily. Start Metoprolol Succinate 25 mg by mouth daily. Start Rosuvastatin 20 mg by mouth daily at bedtime  *If you need a refill on your cardiac medications before your next appointment, please call your pharmacy*   Lab Work: Lab work to be done today--BMP and CBC Your physician recommends that you return for lab work on 10/11/20--BMP  If you have labs (blood work) drawn today and your tests are completely normal, you will receive your results only by: Marland Kitchen MyChart Message (if you have MyChart) OR . A paper copy in the mail If you have any lab test that is abnormal or we need to change your treatment, we will call you to review the results.   Testing/Procedures: Your physician has requested that you have a cardiac catheterization. Cardiac catheterization is used to diagnose and/or treat various heart conditions. Doctors may recommend this procedure for a number of different reasons. The most common reason is to evaluate chest pain. Chest pain can be a symptom of coronary artery disease (CAD), and cardiac catheterization can show whether plaque is narrowing or blocking your heart's arteries. This procedure is also used to evaluate the valves, as well as measure the blood flow and oxygen levels in different parts of your heart. For further information please visit HugeFiesta.[email or web address]. Please follow instruction sheet, as given.  Scheduled for 10/10/20  Your physician has requested that you have an echocardiogram. Echocardiography is a painless test that uses sound waves to create images of your heart. It provides your doctor with information about the size and shape of your heart and how well your heart's chambers and valves are working. This procedure takes approximately one hour. There are no restrictions for this  procedure.    Follow-Up: At [facility], you and your health needs are our priority.  As part of our continuing mission to provide you with exceptional heart care, we have created designated Provider Care Teams.  These Care Teams include your primary Cardiologist (physician) and Advanced Practice Providers (APPs -  Physician Assistants and Nurse Practitioners) who all work together to provide you with the care you need, when you need it.  We recommend signing up for the patient portal called "MyChart".  Sign up information is provided on this After Visit Summary.  MyChart is used to connect with patients for Virtual Visits (Telemedicine).  Patients are able to view lab/test results, encounter notes, upcoming appointments, etc.  Non-urgent messages can be sent to your provider as well.   To learn more about what you can do with MyChart, go to [email or web address].    Your next appointment:   To be arranged after procedure  The format for your next appointment:   In Person  Provider:   You may see Freada Bergeron, MD or one of the following Advanced Practice Providers on your designated Care Team:    Richardson Dopp, PA-C  Robbie Lis, [place]    Other Instructions      [place] OFFICE [place], [place] [address] Dept: [phone] Loc: [place]  10/03/2020  You are scheduled for a Cardiac Catheterization on Monday, December 20 with Dr. Larae Grooms.  1. Please arrive at the [facility] (Main Entrance A) at [facility]: [address]  [place], [place] [address] at 7:00 AM (This time is two hours before your procedure to ensure your preparation). Free valet parking service is available.   Special note: Every effort is made to have your procedure done on time. Please understand that emergencies sometimes delay scheduled procedures.  2.  Diet: Do not eat solid foods after midnight.  The patient may have clear liquids until 5am upon the day of the procedure.  3. Labs: Lab work done in office on 10/03/20. You will also need lab work done on 10/11/20  4. Medication instructions in preparation for your procedure:   Contrast Allergy: No On the morning of your procedure, take your Aspirin and any morning medicines NOT listed above.  You may use sips of water.  5. Plan for one night stay--bring personal belongings. 6. Bring a current list of your medications and current insurance cards. 7. You MUST have a responsible person to drive you home. 8. Someone MUST be with you the first 24 hours after you arrive home or your discharge will be delayed. 9. Please wear clothes that are easy to get on and off and wear slip-on shoes.  Thank you for allowing [place] to care for you!   -- [place] [facility]  Due to recent COVID-19 restrictions implemented by our local and state authorities and in an effort to keep both patients and staff as safe as possible, our hospital system requires COVID-19 testing prior to certain scheduled hospital procedures.  Please go to [place]. [place], [place] [address] on December 18,2021 at 11:55 AM  .  This is a drive up testing site.  You will not need to exit your vehicle.  You will not be billed at the time of testing but may receive a bill later depending on your insurance. You must agree to self-quarantine from the time of your testing until the procedure date on December 20,2021  This should included staying home with ONLY the people you live with.  Avoid take-out, grocery store shopping or leaving the house for any non-emergent reason.  Failure to have your COVID-19 test done on the date and time you have been scheduled will result in cancellation of your procedure.  Please call our office at [phone] if you have any questions.

## 2020-10-04 LAB — BASIC METABOLIC PANEL
BUN/Creatinine Ratio: 19 (ref 10–24)
BUN: 18 mg/dL (ref 8–27)
CO2: 23 mmol/L (ref 20–29)
Calcium: 9.2 mg/dL (ref 8.6–10.2)
Chloride: 103 mmol/L (ref 96–106)
Creatinine, Ser: 0.95 mg/dL (ref 0.76–1.27)
GFR calc Af Amer: 97 mL/min/{1.73_m2} (ref >59)
GFR calc non Af Amer: 84 mL/min/{1.73_m2} (ref >59)
Glucose: 86 mg/dL (ref 65–99)
Potassium: 4.4 mmol/L (ref 3.5–5.2)
Sodium: 138 mmol/L (ref 134–144)

## 2020-10-04 LAB — CBC
Hematocrit: 41.4 % (ref 37.5–51.0)
Hemoglobin: 14.5 g/dL (ref 13.0–17.7)
MCH: 33.3 pg — ABNORMAL HIGH (ref 26.6–33.0)
MCHC: 35 g/dL (ref 31.5–35.7)
MCV: 95 fL (ref 79–97)
Platelets: 240 10*3/uL (ref 150–450)
RBC: 4.35 x10E6/uL (ref 4.14–5.80)
RDW: 12.1 % (ref 11.6–15.4)
WBC: 6 10*3/uL (ref 3.4–10.8)

## 2020-10-06 ENCOUNTER — Telehealth: Payer: Self-pay | Admitting: *Deleted

## 2020-10-06 NOTE — Telephone Encounter (Signed)
Pt contacted pre-catheterization scheduled at [facility] for: Monday October 10, 2020 9 AM Verified arrival time and place: [place]) at: 7 AM   No solid food after midnight prior to cath, clear liquids until 5 AM day of procedure.   AM meds can be  taken pre-cath with sips of water including: ASA 81 mg   Confirmed patient has responsible adult to drive home post procedure and be with patient first 24 hours after arriving home: yes  You are allowed ONE visitor in the waiting room during the time you are at the hospital for your procedure. Both you and your visitor must wear a mask once you enter the hospital.       COVID-19 Pre-Screening Questions:   In the past 14 days have you had any symptoms concerning for COVID-19 infection (fever, chills, cough, or new shortness of breath)? no  In the past 14 days have you been around anyone with known Covid 19? No   Reviewed procedure/mask/visitor instructions, COVID-19 questions with patient.

## 2020-10-06 NOTE — Patient Instructions (Signed)
Thank you for choosing Primary Care at [facility] to be your medical home!    Andrew Fuentes was seen by Melina Schools, DO today.   Andrew Fuentes's primary care provider is Phill Myron, DO.   For the best care possible, you should try to see Phill Myron, DO whenever you come to the clinic.   We look forward to seeing you again soon!  If you have any questions about your visit today, please call us at [phone] or feel free to reach your primary care provider via [place].   Keeping you healthy  Get these tests  Blood pressure- Have your blood pressure checked once a year by your healthcare provider.  Normal blood pressure is 120/80  Weight- Have your body mass index (BMI) calculated to screen for obesity.  BMI is a measure of body fat based on height and weight. You can also calculate your own BMI at ViewBanking.si.  Cholesterol- Have your cholesterol checked every year.  Diabetes- Have your blood sugar checked regularly if you have high blood pressure, high cholesterol, have a family history of diabetes or if you are overweight.  Screening for Colon Cancer- Colonoscopy starting at age 57.  Screening may begin sooner depending on your family history and other health conditions. Follow up colonoscopy as directed by your Gastroenterologist.  Screening for Prostate Cancer- Both blood work (PSA) and a rectal exam help screen for Prostate Cancer.  Screening begins at age 42 with African-American men and at age 17 with Caucasian men.  Screening may begin sooner depending on your family history.  Take these medicines  Aspirin- One aspirin daily can help prevent Heart disease and Stroke.  Flu shot- Every fall.  Tetanus- Every 10 years.  Zostavax- Once after the age of 11 to prevent Shingles.  Pneumonia shot- Once after the age of 22; if you are younger than 23, ask your healthcare provider if you need a Pneumonia shot.  Take these steps  Don't  smoke- If you do smoke, talk to your doctor about quitting.  For tips on how to quit, go to www.smokefree.gov or call 1-800-QUIT-NOW.  Be physically active- Exercise 5 days a week for at least 30 minutes.  If you are not already physically active start slow and gradually work up to 30 minutes of moderate physical activity.  Examples of moderate activity include walking briskly, mowing the yard, dancing, swimming, bicycling, etc.  Eat a healthy diet- Eat a variety of healthy food such as fruits, vegetables, low fat milk, low fat cheese, yogurt, lean meant, poultry, fish, beans, tofu, etc. For more information go to www.thenutritionsource.org  Drink alcohol in moderation- Limit alcohol intake to less than two drinks a day. Never drink and drive.  Dentist- Brush and floss twice daily; visit your dentist twice a year.  Depression- Your emotional health is as important as your physical health. If you're feeling down, or losing interest in things you would normally enjoy please talk to your healthcare provider.  Eye exam- Visit your eye doctor every year.  Safe sex- If you may be exposed to a sexually transmitted infection, use a condom.  Seat belts- Seat belts can save your life; always wear one.  Smoke/Carbon Monoxide detectors- These detectors need to be installed on the appropriate level of your home.  Replace batteries at least once a year.  Skin cancer- When out in the sun, cover up and use sunscreen 15 SPF or higher.  Violence- If anyone is threatening you,  please tell your healthcare provider.  Living Will/ Health care power of attorney- Speak with your healthcare provider and family.

## 2020-10-07 ENCOUNTER — Ambulatory Visit (INDEPENDENT_AMBULATORY_CARE_PROVIDER_SITE_OTHER): Payer: BC Managed Care – PPO | Admitting: Internal Medicine

## 2020-10-07 ENCOUNTER — Other Ambulatory Visit: Payer: Self-pay

## 2020-10-07 ENCOUNTER — Encounter: Payer: Self-pay | Admitting: Internal Medicine

## 2020-10-07 VITALS — BP 152/91 | HR 50 | Temp 97.5°F | Resp 17 | Ht 72.0 in | Wt 233.0 lb

## 2020-10-07 DIAGNOSIS — Z13220 Encounter for screening for lipoid disorders: Secondary | ICD-10-CM | POA: Diagnosis not present

## 2020-10-07 DIAGNOSIS — Z0000 Encounter for general adult medical examination without abnormal findings: Secondary | ICD-10-CM | POA: Diagnosis not present

## 2020-10-07 DIAGNOSIS — Z125 Encounter for screening for malignant neoplasm of prostate: Secondary | ICD-10-CM | POA: Diagnosis not present

## 2020-10-07 DIAGNOSIS — Z114 Encounter for screening for human immunodeficiency virus [HIV]: Secondary | ICD-10-CM | POA: Diagnosis not present

## 2020-10-07 DIAGNOSIS — L309 Dermatitis, unspecified: Secondary | ICD-10-CM

## 2020-10-07 DIAGNOSIS — Z1159 Encounter for screening for other viral diseases: Secondary | ICD-10-CM

## 2020-10-07 DIAGNOSIS — Z1211 Encounter for screening for malignant neoplasm of colon: Secondary | ICD-10-CM

## 2020-10-07 DIAGNOSIS — E7800 Pure hypercholesterolemia, unspecified: Secondary | ICD-10-CM

## 2020-10-07 DIAGNOSIS — I208 Other forms of angina pectoris: Secondary | ICD-10-CM

## 2020-10-07 DIAGNOSIS — I10 Essential (primary) hypertension: Secondary | ICD-10-CM | POA: Insufficient documentation

## 2020-10-07 MED ORDER — TRIAMCINOLONE ACETONIDE 0.1 % EX CREA: 1.0000 "application " | TOPICAL_CREAM | Freq: Two times a day (BID) | CUTANEOUS | 1 refills | Status: DC

## 2020-10-07 NOTE — Addendum Note (Signed)
Addended by: Melina Schools on: 10/07/2020 09:21 AM   Modules accepted: Orders

## 2020-10-07 NOTE — Progress Notes (Addendum)
Subjective:    Andrew Fuentes - 64 y.o. male MRN [id number]  Date of birth: May 14, 1956  HPI  Andrew Fuentes is here for annual exam. Was seen to establish care virtually 2 weeks ago and referred to cardiology for concern for angina. He has a heart cath scheduled 12/20. Was started on Metoprolol, Losartan, Crestor, and ASA.      Health Maintenance:  Health Maintenance Due  Topic Date Due  . Hepatitis C Screening  Never done  . HIV Screening  Never done  . PNA vac Low Risk Adult (1 of 2 - PCV13) Never done  . COLONOSCOPY  12/27/2019    -  reports that he has never smoked. He has never used smokeless tobacco. - Review of Systems: Per HPI. - Past Medical History: Patient Active Problem List   Diagnosis Date Noted  . DYSPHAGIA UNSPECIFIED 12/19/2009  . ELEVATED BLOOD PRESSURE WITHOUT DIAGNOSIS OF HYPERTENSION 11/22/2009   - Medications: reviewed and updated   Objective:   Physical Exam BP (!) 152/91   Pulse (!) 50   Temp (!) 97.5 F (36.4 C) (Temporal)   Resp 17   Ht 6' (1.829 m)   Wt 233 lb (105.7 kg)   SpO2 98%   BMI 31.60 kg/m  Physical Exam Constitutional:      Appearance: He is not diaphoretic.  HENT:     Head: Normocephalic and atraumatic.     Mouth/Throat:     Mouth: Oropharynx is clear and moist.      Comments: TMs normal bilaterally Eyes:     Extraocular Movements: EOM normal.     Conjunctiva/sclera: Conjunctivae normal.     Pupils: Pupils are equal, round, and reactive to light.  Neck:     Thyroid: No thyromegaly.  Cardiovascular:     Rate and Rhythm: Normal rate and regular rhythm.     Pulses: Intact distal pulses.     Heart sounds: Normal heart sounds. No murmur heard.   Pulmonary:     Effort: Pulmonary effort is normal. No respiratory distress.     Breath sounds: Normal breath sounds. No wheezing.  Abdominal:     General: Bowel sounds are normal. There is no distension.     Palpations: Abdomen is soft.     Tenderness: There is no  abdominal tenderness. There is no guarding or rebound.  Musculoskeletal:        General: No deformity or edema. Normal range of motion.     Cervical back: Normal range of motion and neck supple.  Lymphadenopathy:     Cervical: No cervical adenopathy.  Skin:    General: Skin is warm and dry.     Findings: No rash.     Comments: Scattered dry scaling patches on shin of LLE.   Neurological:     Mental Status: He is alert and oriented to person, place, and time.     Gait: Gait is intact.  Psychiatric:        Mood and Affect: Mood and affect normal.        Judgment: Judgment normal.            Assessment & Plan:   1. Annual physical exam Counseled on 150 minutes of exercise per week, healthy eating (including decreased daily intake of saturated fats, cholesterol, added sugars, sodium), STI prevention, routine healthcare maintenance. - Lipid Panel  2. Lipid screening - Lipid Panel  3. Screening PSA (prostate specific antigen) Discussed risk vs. benefit of screening for prostate  cancer. Patient opted in.  - PSA  4. Screening for HIV (human immunodeficiency virus) - HIV antibody (with reflex)  5. Need for hepatitis C screening test - HCV Ab w/Rflx to Verification  6. Stable angina ([place]) Has a cardiac catheterization scheduled for 12/20. Followed by cardiology.   7. Primary hypertension BP slightly above goal. However, was just recently started on two new hypertension agents within past few days. Will continue to monitor on current regimen.   8. Pure hypercholesterolemia Obtain baseline lipid panel. Continue Crestor and ASA.  - Lipid Panel  9. Eczema, unspecified type - triamcinolone (KENALOG) 0.1 %; Apply 1 application topically 2 (two) times daily.  Dispense: 30 g; Refill: 1  10. Screening for colon cancer Patient had colonoscopy in 2011. Now overdue for 10 year interval screening; referral placed.  - Ambulatory referral to Gastroenterology    Phill Myron,  D.O. 10/07/2020, 8:49 AM Primary Care at [facility]

## 2020-10-08 ENCOUNTER — Other Ambulatory Visit (HOSPITAL_COMMUNITY)
Admission: RE | Admit: 2020-10-08 | Discharge: 2020-10-08 | Disposition: A | Discharge status: Home / Self Care | Payer: BC Managed Care – PPO | Source: Ambulatory Visit | Attending: Interventional Cardiology | Admitting: Interventional Cardiology

## 2020-10-08 DIAGNOSIS — Z20822 Contact with and (suspected) exposure to covid-19: Secondary | ICD-10-CM | POA: Insufficient documentation

## 2020-10-08 DIAGNOSIS — E785 Hyperlipidemia, unspecified: Secondary | ICD-10-CM | POA: Diagnosis not present

## 2020-10-08 DIAGNOSIS — I25118 Atherosclerotic heart disease of native coronary artery with other forms of angina pectoris: Secondary | ICD-10-CM | POA: Diagnosis not present

## 2020-10-08 DIAGNOSIS — I10 Essential (primary) hypertension: Secondary | ICD-10-CM | POA: Diagnosis not present

## 2020-10-08 DIAGNOSIS — E7800 Pure hypercholesterolemia, unspecified: Secondary | ICD-10-CM | POA: Diagnosis not present

## 2020-10-08 DIAGNOSIS — Z01812 Encounter for preprocedural laboratory examination: Secondary | ICD-10-CM | POA: Insufficient documentation

## 2020-10-08 LAB — SARS CORONAVIRUS 2 (TAT 6-24 HRS): SARS Coronavirus 2: NEGATIVE

## 2020-10-10 ENCOUNTER — Encounter (HOSPITAL_COMMUNITY)
Admission: RE | Disposition: A | Discharge status: Home / Self Care | Payer: Self-pay | Source: Home / Self Care | Attending: Interventional Cardiology

## 2020-10-10 ENCOUNTER — Encounter: Payer: Self-pay | Admitting: *Deleted

## 2020-10-10 ENCOUNTER — Ambulatory Visit (HOSPITAL_COMMUNITY)
Admission: RE | Admit: 2020-10-10 | Discharge: 2020-10-10 | Disposition: A | Discharge status: Home / Self Care | Payer: BC Managed Care – PPO | Attending: Interventional Cardiology | Admitting: Interventional Cardiology

## 2020-10-10 ENCOUNTER — Other Ambulatory Visit: Payer: Self-pay

## 2020-10-10 DIAGNOSIS — I25119 Atherosclerotic heart disease of native coronary artery with unspecified angina pectoris: Secondary | ICD-10-CM

## 2020-10-10 DIAGNOSIS — E7800 Pure hypercholesterolemia, unspecified: Secondary | ICD-10-CM | POA: Insufficient documentation

## 2020-10-10 DIAGNOSIS — I25118 Atherosclerotic heart disease of native coronary artery with other forms of angina pectoris: Secondary | ICD-10-CM | POA: Insufficient documentation

## 2020-10-10 DIAGNOSIS — Z006 Encounter for examination for normal comparison and control in clinical research program: Secondary | ICD-10-CM

## 2020-10-10 DIAGNOSIS — I10 Essential (primary) hypertension: Secondary | ICD-10-CM | POA: Insufficient documentation

## 2020-10-10 DIAGNOSIS — Z20822 Contact with and (suspected) exposure to covid-19: Secondary | ICD-10-CM | POA: Insufficient documentation

## 2020-10-10 DIAGNOSIS — E785 Hyperlipidemia, unspecified: Secondary | ICD-10-CM | POA: Insufficient documentation

## 2020-10-10 HISTORY — PX: LEFT HEART CATH AND CORONARY ANGIOGRAPHY: CATH118249

## 2020-10-10 SURGERY — LEFT HEART CATH AND CORONARY ANGIOGRAPHY
Anesthesia: LOCAL

## 2020-10-10 MED ORDER — SODIUM CHLORIDE 0.9 % IV SOLN: INTRAVENOUS | Status: AC

## 2020-10-10 MED ORDER — VERAPAMIL HCL 2.5 MG/ML IV SOLN
INTRAVENOUS | Status: AC
  Filled 2020-10-10: qty 2

## 2020-10-10 MED ORDER — SODIUM CHLORIDE 0.9% FLUSH: 3.0000 mL | Freq: Two times a day (BID) | INTRAVENOUS | Status: DC

## 2020-10-10 MED ORDER — HYDRALAZINE HCL 20 MG/ML IJ SOLN: 10.0000 mg | INTRAMUSCULAR | Status: DC | PRN

## 2020-10-10 MED ORDER — IOHEXOL 350 MG/ML SOLN
INTRAVENOUS | Status: DC | PRN
  Administered 2020-10-10: 11:00:00 150 mL

## 2020-10-10 MED ORDER — VERAPAMIL HCL 2.5 MG/ML IV SOLN
INTRAVENOUS | Status: DC | PRN
  Administered 2020-10-10: 11:00:00 10 mL via INTRA_ARTERIAL

## 2020-10-10 MED ORDER — MIDAZOLAM HCL 2 MG/2ML IJ SOLN
INTRAMUSCULAR | Status: AC
  Filled 2020-10-10: qty 2

## 2020-10-10 MED ORDER — HEPARIN SODIUM (PORCINE) 1000 UNIT/ML IJ SOLN
INTRAMUSCULAR | Status: AC
  Filled 2020-10-10: qty 1

## 2020-10-10 MED ORDER — HEPARIN (PORCINE) IN NACL 1000-0.9 UT/500ML-% IV SOLN
INTRAVENOUS | Status: AC
  Filled 2020-10-10: qty 1000

## 2020-10-10 MED ORDER — LIDOCAINE HCL (PF) 1 % IJ SOLN
INTRAMUSCULAR | Status: AC
  Filled 2020-10-10: qty 30

## 2020-10-10 MED ORDER — HEPARIN SODIUM (PORCINE) 1000 UNIT/ML IJ SOLN
INTRAMUSCULAR | Status: DC | PRN
  Administered 2020-10-10: 5000 [IU] via INTRAVENOUS

## 2020-10-10 MED ORDER — FENTANYL CITRATE (PF) 100 MCG/2ML IJ SOLN
INTRAMUSCULAR | Status: DC | PRN
  Administered 2020-10-10 (×2): 25 ug via INTRAVENOUS

## 2020-10-10 MED ORDER — SODIUM CHLORIDE 0.9 % IV SOLN: 250.0000 mL | INTRAVENOUS | Status: DC | PRN

## 2020-10-10 MED ORDER — ACETAMINOPHEN 325 MG PO TABS: 650.0000 mg | ORAL_TABLET | ORAL | Status: DC | PRN

## 2020-10-10 MED ORDER — ONDANSETRON HCL 4 MG/2ML IJ SOLN: 4.0000 mg | Freq: Four times a day (QID) | INTRAMUSCULAR | Status: DC | PRN

## 2020-10-10 MED ORDER — ASPIRIN 81 MG PO CHEW
81.0000 mg | CHEWABLE_TABLET | ORAL | Status: AC
  Administered 2020-10-10: 08:00:00 81 mg via ORAL
  Filled 2020-10-10: qty 1

## 2020-10-10 MED ORDER — MIDAZOLAM HCL 2 MG/2ML IJ SOLN
INTRAMUSCULAR | Status: DC | PRN
  Administered 2020-10-10: 2 mg via INTRAVENOUS
  Administered 2020-10-10: 1 mg via INTRAVENOUS

## 2020-10-10 MED ORDER — SODIUM CHLORIDE 0.9% FLUSH: 3.0000 mL | INTRAVENOUS | Status: DC | PRN

## 2020-10-10 MED ORDER — SODIUM CHLORIDE 0.9 % WEIGHT BASED INFUSION: 1.0000 mL/kg/h | INTRAVENOUS | Status: DC

## 2020-10-10 MED ORDER — FENTANYL CITRATE (PF) 100 MCG/2ML IJ SOLN
INTRAMUSCULAR | Status: AC
  Filled 2020-10-10: qty 2

## 2020-10-10 MED ORDER — SODIUM CHLORIDE 0.9 % WEIGHT BASED INFUSION
3.0000 mL/kg/h | INTRAVENOUS | Status: AC
  Administered 2020-10-10: 08:00:00 3 mL/kg/h via INTRAVENOUS

## 2020-10-10 MED ORDER — LABETALOL HCL 5 MG/ML IV SOLN: 10.0000 mg | INTRAVENOUS | Status: DC | PRN

## 2020-10-10 MED ORDER — LIDOCAINE HCL (PF) 1 % IJ SOLN
INTRAMUSCULAR | Status: DC | PRN
  Administered 2020-10-10: 2 mL

## 2020-10-10 MED ORDER — SODIUM CHLORIDE 0.9 % IV SOLN
250.0000 mL | INTRAVENOUS | Status: DC | PRN
Start: 2020-10-10 — End: 2020-10-10

## 2020-10-10 MED ORDER — HEPARIN (PORCINE) IN NACL 1000-0.9 UT/500ML-% IV SOLN
INTRAVENOUS | Status: DC | PRN
  Administered 2020-10-10 (×2): 500 mL

## 2020-10-10 SURGICAL SUPPLY — 12 items
CATH 5FR JL3.5 JR4 ANG PIG MP (CATHETERS) ×1 IMPLANT
CATH INFINITI 5 FR AR1 MOD (CATHETERS) ×1 IMPLANT
CATH INFINITI 5 FR AR2 MOD (CATHETERS) ×2 IMPLANT
CATH INFINITI 5FR AL1 (CATHETERS) ×1 IMPLANT
DEVICE RAD COMP TR BAND LRG (VASCULAR PRODUCTS) ×1 IMPLANT
GLIDESHEATH SLEND SS 6F .021 (SHEATH) ×2 IMPLANT
GUIDEWIRE INQWIRE 1.5J.035X260 (WIRE) ×1 IMPLANT
INQWIRE 1.5J .035X260CM (WIRE) ×2
KIT HEART LEFT (KITS) ×2 IMPLANT
PACK CARDIAC CATHETERIZATION (CUSTOM PROCEDURE TRAY) ×2 IMPLANT
TRANSDUCER W/STOPCOCK (MISCELLANEOUS) ×2 IMPLANT
TUBING CIL FLEX 10 FLL-RA (TUBING) ×2 IMPLANT

## 2020-10-10 NOTE — Progress Notes (Signed)
Ambulated in hallway and to the bathroom to void tol well.  

## 2020-10-10 NOTE — Progress Notes (Signed)
Discharge instructions reviewed with pt and his son in law both voice understanding.

## 2020-10-10 NOTE — Interval H&P Note (Signed)
Cath Lab Visit (complete for each Cath Lab visit)  Clinical Evaluation Leading to the Procedure:   ACS: No.  Non-ACS:    Anginal Classification: CCS III  Anti-ischemic medical therapy: Minimal Therapy (1 class of medications)  Non-Invasive Test Results: No non-invasive testing performed  Prior CABG: No previous CABG      History and Physical Interval Note:  10/10/2020 10:26 AM  Andrew Fuentes  has presented today for surgery, with the diagnosis of chest pain.  The various methods of treatment have been discussed with the patient and family. After consideration of risks, benefits and other options for treatment, the patient has consented to  Procedure(s): LEFT HEART CATH AND CORONARY ANGIOGRAPHY (N/A) as a surgical intervention.  The patient's history has been reviewed, patient examined, no change in status, stable for surgery.  I have reviewed the patient's chart and labs.  Questions were answered to the patient's satisfaction.     Larae Grooms

## 2020-10-10 NOTE — Discharge Instructions (Signed)

## 2020-10-11 ENCOUNTER — Other Ambulatory Visit: Payer: BC Managed Care – PPO

## 2020-10-11 ENCOUNTER — Encounter (HOSPITAL_COMMUNITY): Payer: Self-pay | Admitting: Interventional Cardiology

## 2020-10-11 DIAGNOSIS — I208 Other forms of angina pectoris: Secondary | ICD-10-CM

## 2020-10-11 DIAGNOSIS — I10 Essential (primary) hypertension: Secondary | ICD-10-CM

## 2020-10-11 LAB — LIPID PANEL
Chol/HDL Ratio: 4.7 ratio (ref 0.0–5.0)
Cholesterol, Total: 218 mg/dL — ABNORMAL HIGH (ref 100–199)
HDL: 46 mg/dL (ref >39)
LDL Chol Calc (NIH): 157 mg/dL — ABNORMAL HIGH (ref 0–99)
Triglycerides: 84 mg/dL (ref 0–149)
VLDL Cholesterol Cal: 15 mg/dL (ref 5–40)

## 2020-10-11 LAB — BASIC METABOLIC PANEL
BUN/Creatinine Ratio: 18 (ref 10–24)
BUN: 19 mg/dL (ref 8–27)
CO2: 23 mmol/L (ref 20–29)
Calcium: 8.9 mg/dL (ref 8.6–10.2)
Chloride: 103 mmol/L (ref 96–106)
Creatinine, Ser: 1.04 mg/dL (ref 0.76–1.27)
GFR calc Af Amer: 87 mL/min/{1.73_m2} (ref >59)
GFR calc non Af Amer: 75 mL/min/{1.73_m2} (ref >59)
Glucose: 95 mg/dL (ref 65–99)
Potassium: 4.7 mmol/L (ref 3.5–5.2)
Sodium: 137 mmol/L (ref 134–144)

## 2020-10-11 LAB — PSA: Prostate Specific Ag, Serum: 6 ng/mL — ABNORMAL HIGH (ref 0.0–4.0)

## 2020-10-11 LAB — HCV AB W/RFLX TO VERIFICATION: HCV Ab: 0.2 s/co ratio (ref 0.0–0.9)

## 2020-10-11 LAB — HIV ANTIBODY (ROUTINE TESTING W REFLEX): HIV Screen 4th Generation wRfx: NONREACTIVE

## 2020-10-11 LAB — HCV INTERPRETATION

## 2020-10-12 ENCOUNTER — Other Ambulatory Visit: Payer: Self-pay | Admitting: Internal Medicine

## 2020-10-12 DIAGNOSIS — R972 Elevated prostate specific antigen [PSA]: Secondary | ICD-10-CM | POA: Insufficient documentation

## 2020-10-12 MED ORDER — ROSUVASTATIN CALCIUM 40 MG PO TABS: 40.0000 mg | ORAL_TABLET | Freq: Every day | ORAL | 1 refills | Status: DC

## 2020-10-31 ENCOUNTER — Other Ambulatory Visit: Payer: Self-pay

## 2020-10-31 ENCOUNTER — Ambulatory Visit (HOSPITAL_COMMUNITY): Payer: BC Managed Care – PPO | Attending: Internal Medicine

## 2020-10-31 DIAGNOSIS — I208 Other forms of angina pectoris: Secondary | ICD-10-CM

## 2020-10-31 DIAGNOSIS — I10 Essential (primary) hypertension: Secondary | ICD-10-CM | POA: Diagnosis present

## 2020-10-31 LAB — ECHOCARDIOGRAM COMPLETE
Area-P 1/2: 2.55 cm2
S' Lateral: 2.6 cm

## 2020-11-07 ENCOUNTER — Ambulatory Visit: Payer: Self-pay | Admitting: Urology

## 2020-11-28 ENCOUNTER — Other Ambulatory Visit: Payer: Self-pay

## 2020-11-28 ENCOUNTER — Ambulatory Visit: Payer: Self-pay | Admitting: Urology

## 2020-11-28 ENCOUNTER — Ambulatory Visit (INDEPENDENT_AMBULATORY_CARE_PROVIDER_SITE_OTHER): Payer: BC Managed Care – PPO | Admitting: Urology

## 2020-11-28 ENCOUNTER — Encounter: Payer: Self-pay | Admitting: Urology

## 2020-11-28 VITALS — BP 148/70 | HR 57 | Ht 72.0 in | Wt 220.0 lb

## 2020-11-28 DIAGNOSIS — R972 Elevated prostate specific antigen [PSA]: Secondary | ICD-10-CM

## 2020-11-28 NOTE — Progress Notes (Signed)
11/28/2020 2:58 PM   Sylvestre Rathgeber [facility] [birth year] [id number]  Referring provider: Nicolette Bang, DO [address] [place],  [place] [address]  Chief Complaint  Patient presents with  . Elevated PSA    HPI: Andrew Fuentes is a 65 y.o. male referred for evaluation of an elevated PSA.   PSA 10/07/2020 was elevated at 6.0  Only prior PSA level was 1.73 in 2011  Denies family history prostate cancer  No bothersome LUTS, occasional postvoid dribbling  Denies dysuria, gross hematuria  No flank, abdominal or pelvic pain   PMH: Past Medical History:  Diagnosis Date  . MRSA (methicillin resistant staph aureus) culture positive     Surgical History: Past Surgical History:  Procedure Laterality Date  . LEFT HEART CATH AND CORONARY ANGIOGRAPHY N/A 10/10/2020   Procedure: LEFT HEART CATH AND CORONARY ANGIOGRAPHY;  Surgeon: Jettie Booze, MD;  Location: [place];  Service: Cardiovascular;  Laterality: N/A;    Home Medications:  Allergies as of 11/28/2020      Reactions   Tape       Medication List       Accurate as of November 28, 2020  2:58 PM. If you have any questions, ask your nurse or doctor.        aspirin EC 81 MG tablet Take 1 tablet (81 mg total) by mouth daily. Swallow whole.   losartan 25 MG tablet Commonly known as: COZAAR Take 1 tablet (25 mg total) by mouth daily.   metoprolol succinate 25 MG 24 hr tablet Commonly known as: Toprol XL Take 1 tablet (25 mg total) by mouth daily.   pantoprazole 40 MG tablet Commonly known as: PROTONIX Take 1 tablet (40 mg total) by mouth daily.   rosuvastatin 40 MG tablet Commonly known as: CRESTOR Take 1 tablet (40 mg total) by mouth daily.   triamcinolone 0.1 % Commonly known as: KENALOG Apply 1 application topically 2 (two) times daily.       Allergies:  Allergies  Allergen Reactions  . Tape     Family History: History reviewed. No pertinent family  history.  Social History:  reports that he has never smoked. He has never used smokeless tobacco. He reports that he does not drink alcohol and does not use drugs.   Physical Exam: BP (!) 148/70   Pulse (!) 57   Ht 6' (1.829 m)   Wt 220 lb (99.8 kg)   BMI 29.84 kg/m   Constitutional:  Alert and oriented, No acute distress. HEENT: [place] AT, moist mucus membranes.  Trachea midline, no masses. Cardiovascular: No clubbing, cyanosis, or edema. Respiratory: Normal respiratory effort, no increased work of breathing. GI: Abdomen is soft, nontender, nondistended, no abdominal masses GU: Prostate 35 g, smooth without nodules Lymph: No cervical or inguinal lymphadenopathy. Skin: No rashes, bruises or suspicious lesions. Neurologic: Grossly intact, no focal deficits, moving all 4 extremities. Psychiatric: Normal mood and affect.  Laboratory Data:  Lab Results  Component Value Date   PSA 1.73 11/22/2009    Assessment & Plan:    1.  Elevated PSA  Although PSA is a prostate cancer screening test he was informed that cancer is not the most common cause of an elevated PSA. Other potential causes including BPH and inflammation were discussed. He was informed that the only way to adequately diagnose prostate cancer would be a transrectal ultrasound and biopsy of the prostate. The procedure was discussed including potential risks of bleeding and infection/sepsis. He was  also informed that a negative biopsy does not conclusively rule out the possibility that prostate cancer may be present and that continued monitoring is required. The use of newer adjunctive blood tests including PHI and 4kScore were discussed. The use of multiparametric prostate MRI was also discussed in addition to continued periodic surveillance.  Urinalysis was ordered and if evidence of infection a culture will be ordered  If urinalysis negative will treat with an empiric 30-day alpha-blocker with a follow-up PSA in approximately  1 month   Abbie Sons, MD  [facility] [address], [place], [place] [address] [phone]

## 2020-11-29 ENCOUNTER — Encounter: Payer: Self-pay | Admitting: Urology

## 2020-11-29 LAB — URINALYSIS, COMPLETE
Bilirubin, UA: NEGATIVE
Glucose, UA: NEGATIVE
Ketones, UA: NEGATIVE
Leukocytes,UA: NEGATIVE
Nitrite, UA: NEGATIVE
Specific Gravity, UA: 1.025 (ref 1.005–1.030)
Urobilinogen, Ur: 1 mg/dL (ref 0.2–1.0)
pH, UA: 7 (ref 5.0–7.5)

## 2020-11-29 LAB — MICROSCOPIC EXAMINATION: Bacteria, UA: NONE SEEN

## 2020-11-30 ENCOUNTER — Encounter: Payer: Self-pay | Admitting: Urology

## 2020-11-30 ENCOUNTER — Other Ambulatory Visit: Payer: Self-pay | Admitting: Urology

## 2020-11-30 MED ORDER — TAMSULOSIN HCL 0.4 MG PO CAPS: 0.4000 mg | ORAL_CAPSULE | Freq: Every day | ORAL | 0 refills | Status: DC

## 2020-12-05 ENCOUNTER — Other Ambulatory Visit: Payer: Self-pay | Admitting: Internal Medicine

## 2020-12-05 DIAGNOSIS — R079 Chest pain, unspecified: Secondary | ICD-10-CM

## 2020-12-09 NOTE — Research (Signed)
IDENTIFY Informed Consent                  Subject Name:   Andrew Fuentes   Subject met inclusion and exclusion criteria.  The informed consent form, study requirements and expectations were reviewed with the subject and questions and concerns were addressed prior to the signing of the consent form.  The subject verbalized understanding of the trial requirements.  The subject agreed to participate in the IDENTIFY trial and signed the informed consent.  The informed consent was obtained prior to performance of any protocol-specific procedures for the subject.  A copy of the signed informed consent was given to the subject and a copy was placed in the subject's medical record. This patient was consented by Philemon Kingdom on 10-10-20 at 07:30 a.m.   [place] Lettie Czarnecki, Research Assistant  10/10/2020  07:30 a.m.

## 2020-12-23 ENCOUNTER — Other Ambulatory Visit: Payer: Self-pay

## 2020-12-23 DIAGNOSIS — R972 Elevated prostate specific antigen [PSA]: Secondary | ICD-10-CM

## 2020-12-26 ENCOUNTER — Encounter: Payer: Self-pay | Admitting: Gastroenterology

## 2020-12-26 ENCOUNTER — Other Ambulatory Visit: Payer: BC Managed Care – PPO

## 2020-12-26 ENCOUNTER — Ambulatory Visit: Payer: BC Managed Care – PPO

## 2020-12-26 ENCOUNTER — Other Ambulatory Visit: Payer: Self-pay

## 2020-12-26 VITALS — Ht 72.0 in | Wt 217.0 lb

## 2020-12-26 DIAGNOSIS — R972 Elevated prostate specific antigen [PSA]: Secondary | ICD-10-CM

## 2020-12-26 DIAGNOSIS — Z86010 Personal history of colonic polyps: Secondary | ICD-10-CM

## 2020-12-26 MED ORDER — PLENVU 140 G PO SOLR: 1.0000 | Freq: Once | ORAL | 0 refills | Status: AC

## 2020-12-26 MED ORDER — PLENVU 140 G PO SOLR: 1.0000 | Freq: Once | ORAL | 0 refills | Status: DC

## 2020-12-26 NOTE — Progress Notes (Signed)
No egg or soy allergy known to patient  No issues with past sedation with any surgeries or procedures No intubation problems in the past  No FH of Malignant Hyperthermia No diet pills per patient No home 02 use per patient  No blood thinners per patient  Pt denies issues with constipation  No A fib or A flutter  EMMI video to pt or via [place] 19 guidelines implemented in PV today with Pt and RN  Pt is fully vaccinated  for Covid   [place]  Coupon given to pt in PV today , Code to Pharmacy and  NO PA's for preps discussed with pt In PV today  Discussed with pt there will be an out-of-pocket cost for prep and that varies from $0 to 70 dollars   Due to the COVID-19 pandemic we are asking patients to follow certain guidelines.  Pt aware of COVID protocols and [facility] guidelines

## 2020-12-27 ENCOUNTER — Telehealth: Payer: Self-pay | Admitting: Urology

## 2020-12-27 LAB — PSA: Prostate Specific Ag, Serum: 5.6 ng/mL — ABNORMAL HIGH (ref 0.0–4.0)

## 2020-12-27 NOTE — Telephone Encounter (Signed)
Repeat PSA remains elevated at 5.6.  Recommend scheduling prostate biopsy

## 2020-12-28 ENCOUNTER — Encounter: Payer: Self-pay | Admitting: *Deleted

## 2020-12-28 ENCOUNTER — Telehealth: Payer: Self-pay | Admitting: *Deleted

## 2020-12-28 NOTE — Telephone Encounter (Signed)
Left a message on voice mail for patient to call the office. Also sent a my chart message.

## 2020-12-28 NOTE — Telephone Encounter (Signed)
   [facility] Pre-operative Risk Assessment    HEARTCARE STAFF: - Please ensure there is not already an duplicate clearance open for this procedure. - Under Visit Info/Reason for Call, type in Other and utilize the format Clearance MM/DD/YY or Clearance TBD. Do not use dashes or single digits. - If request is for dental extraction, please clarify the # of teeth to be extracted.  Request for surgical clearance:  1. What type of surgery is being performed?  PROSTATE BIOPSY   2. When is this surgery scheduled?  TBD   3. What type of clearance is required (medical clearance vs. Pharmacy clearance to hold med vs. Both)?  BOTH  4. Are there any medications that need to be held prior to surgery and how long? ASPIRIN 5-7 DAYS   5. Practice name and name of physician performing surgery?  [facility]   6. What is the office phone number?  [phone]   7.   What is the office fax number?  [phone]  8.   Anesthesia type (None, local, MAC, general) ?    Jeanann Lewandowsky 12/28/2020, 4:39 PM  _________________________________________________________________   (provider comments below)

## 2020-12-29 NOTE — Telephone Encounter (Signed)
*  for cardiac clearance for upcoming prostate biopsy

## 2020-12-29 NOTE — Telephone Encounter (Signed)
  Primary Cardiologist:Heather Renae Fickle, MD  Chart reviewed as part of pre-operative protocol coverage. Because of Kaenan Jake Wilford's past medical history and time since last visit, he/she will require a follow-up visit in order to better assess preoperative cardiovascular risk.  Pre-op covering staff: - Please schedule appointment and call patient to inform them. - Please contact requesting surgeon's office via preferred method (i.e, phone, fax) to inform them of need for appointment prior to surgery.  If applicable, this message will also be routed to pharmacy pool and/or primary cardiologist for input on holding anticoagulant/antiplatelet agent as requested below so that this information is available at time of patient's appointment.   Deberah Pelton, NP  12/29/2020, 10:39 AM

## 2020-12-29 NOTE — Telephone Encounter (Signed)
LM2CB-NEEDS APPT FOR PROSTATE BIOPSY

## 2020-12-30 ENCOUNTER — Encounter: Payer: Self-pay | Admitting: General Practice

## 2020-12-30 NOTE — Telephone Encounter (Signed)
Attempted to contact patient left voice message. Will forward to scheduling

## 2020-12-30 NOTE — Telephone Encounter (Signed)
Left voicemail for patient to call office to schedule appointment.

## 2021-01-09 ENCOUNTER — Other Ambulatory Visit: Payer: Self-pay

## 2021-01-09 ENCOUNTER — Ambulatory Visit (AMBULATORY_SURGERY_CENTER): Payer: BC Managed Care – PPO | Admitting: Gastroenterology

## 2021-01-09 ENCOUNTER — Encounter: Payer: Self-pay | Admitting: Gastroenterology

## 2021-01-09 VITALS — BP 137/76 | HR 50 | Temp 97.1°F | Resp 14 | Ht 72.0 in | Wt 217.0 lb

## 2021-01-09 DIAGNOSIS — Z86010 Personal history of colonic polyps: Secondary | ICD-10-CM | POA: Diagnosis not present

## 2021-01-09 DIAGNOSIS — D123 Benign neoplasm of transverse colon: Secondary | ICD-10-CM

## 2021-01-09 MED ORDER — SODIUM CHLORIDE 0.9 % IV SOLN
500.0000 mL | INTRAVENOUS | Status: DC
Start: 2021-01-09 — End: 2021-01-09

## 2021-01-09 NOTE — Progress Notes (Signed)
Report to PACU, RN, vss, BBS= Clear.  

## 2021-01-09 NOTE — Telephone Encounter (Signed)
Our office has left a number of message for pt to call to schedule a pre op appt with Dr. Johney Frame or APP. Message to call back was left today as well.

## 2021-01-09 NOTE — Patient Instructions (Signed)
Please read handouts provided. Continue present medications. Await pathology results.   YOU HAD AN ENDOSCOPIC PROCEDURE TODAY AT THE [facility]:   Refer to the procedure report that was given to you for any specific questions about what was found during the examination.  If the procedure report does not answer your questions, please call your gastroenterologist to clarify.  If you requested that your care partner not be given the details of your procedure findings, then the procedure report has been included in a sealed envelope for you to review at your convenience later.  YOU SHOULD EXPECT: Some feelings of bloating in the abdomen. Passage of more gas than usual.  Walking can help get rid of the air that was put into your GI tract during the procedure and reduce the bloating. If you had a lower endoscopy (such as a colonoscopy or flexible sigmoidoscopy) you may notice spotting of blood in your stool or on the toilet paper. If you underwent a bowel prep for your procedure, you may not have a normal bowel movement for a few days.  Please Note:  You might notice some irritation and congestion in your nose or some drainage.  This is from the oxygen used during your procedure.  There is no need for concern and it should clear up in a day or so.  SYMPTOMS TO REPORT IMMEDIATELY:  Following lower endoscopy (colonoscopy or flexible sigmoidoscopy):  Excessive amounts of blood in the stool  Significant tenderness or worsening of abdominal pains  Swelling of the abdomen that is new, acute  Fever of 100F or higher   For urgent or emergent issues, a gastroenterologist can be reached at any hour by calling [phone]. Do not use MyChart messaging for urgent concerns.    DIET:  We do recommend a small meal at first, but then you may proceed to your regular diet.  Drink plenty of fluids but you should avoid alcoholic beverages for 24 hours.  ACTIVITY:  You should plan to take it easy  for the rest of today and you should NOT DRIVE or use heavy machinery until tomorrow (because of the sedation medicines used during the test).    FOLLOW UP: Our staff will call the number listed on your records 48-72 hours following your procedure to check on you and address any questions or concerns that you may have regarding the information given to you following your procedure. If we do not reach you, we will leave a message.  We will attempt to reach you two times.  During this call, we will ask if you have developed any symptoms of COVID 19. If you develop any symptoms (ie: fever, flu-like symptoms, shortness of breath, cough etc.) before then, please call [phone].  If you test positive for Covid 19 in the 2 weeks post procedure, please call and report this information to us.    If any biopsies were taken you will be contacted by phone or by letter within the next 1-3 weeks.  Please call us at [phone] if you have not heard about the biopsies in 3 weeks.    SIGNATURES/CONFIDENTIALITY: You and/or your care partner have signed paperwork which will be entered into your electronic medical record.  These signatures attest to the fact that that the information above on your After Visit Summary has been reviewed and is understood.  Full responsibility of the confidentiality of this discharge information lies with you and/or your care-partner.  

## 2021-01-09 NOTE — Progress Notes (Signed)
Called to room to assist during endoscopic procedure.  Patient ID and intended procedure confirmed with present staff. Received instructions for my participation in the procedure from the performing physician.  

## 2021-01-09 NOTE — Progress Notes (Signed)
Pt's states no medical or surgical changes since previsit or office visit. 

## 2021-01-09 NOTE — Op Note (Signed)
[place] Patient Name: Andrew Fuentes Procedure Date: 01/09/2021 10:13 AM MRN: [id number] Endoscopist: Mauri Pole , MD Age: 65 Referring MD:  Date of Birth: 1956-01-23 Gender: Male Account #: [id number] Procedure:                Colonoscopy Indications:              High risk colon cancer surveillance: Personal                            history of colonic polyps Medicines:                Monitored Anesthesia Care Procedure:                Pre-Anesthesia Assessment:                           - Prior to the procedure, a History and Physical                            was performed, and patient medications and                            allergies were reviewed. The patient's tolerance of                            previous anesthesia was also reviewed. The risks                            and benefits of the procedure and the sedation                            options and risks were discussed with the patient.                            All questions were answered, and informed consent                            was obtained. Prior Anticoagulants: The patient has                            taken no previous anticoagulant or antiplatelet                            agents. ASA Grade Assessment: II - A patient with                            mild systemic disease. After reviewing the risks                            and benefits, the patient was deemed in                            satisfactory condition to undergo the procedure.  After obtaining informed consent, the colonoscope                            was passed under direct vision. Throughout the                            procedure, the patient's blood pressure, pulse, and                            oxygen saturations were monitored continuously. The                            Olympus PCF-H190DL [id number]) Colonoscope was                            introduced through the anus and  advanced to the the                            cecum, identified by appendiceal orifice and                            ileocecal valve. The colonoscopy was performed                            without difficulty. The patient tolerated the                            procedure well. The quality of the bowel                            preparation was adequate. The ileocecal valve,                            appendiceal orifice, and rectum were photographed. Scope In: 10:31:11 AM Scope Out: 11:59:06 AM Scope Withdrawal Time: 1 hour 13 minutes 28 seconds  Total Procedure Duration: 1 hour 27 minutes 55 seconds  Findings:                 The perianal and digital rectal examinations were                            normal.                           A 7 mm polyp was found in the transverse colon. The                            polyp was sessile. The polyp was removed with a                            cold snare. Resection and retrieval were complete.                           Scattered small and large-mouthed diverticula were  found in the sigmoid colon, descending colon and                            ascending colon.                           Non-bleeding internal hemorrhoids were found during                            retroflexion. The hemorrhoids were medium-sized. Complications:            No immediate complications. Estimated Blood Loss:     Estimated blood loss was minimal. Impression:               - One 7 mm polyp in the transverse colon, removed                            with a cold snare. Resected and retrieved.                           - Diverticulosis in the sigmoid colon, in the                            descending colon and in the ascending colon.                           - Non-bleeding internal hemorrhoids. Recommendation:           - Patient has a contact number available for                            emergencies. The signs and symptoms of potential                             delayed complications were discussed with the                            patient. Return to normal activities tomorrow.                            Written discharge instructions were provided to the                            patient.                           - Resume previous diet.                           - Continue present medications.                           - Await pathology results.                           - Repeat colonoscopy in 5 years for surveillance  based on pathology results. Mauri Pole, MD 01/09/2021 11:26:54 AM This report has been signed electronically.

## 2021-01-10 NOTE — Telephone Encounter (Signed)
Called the requesting office of [facility]. Left a detailed message Denny Peon informing her that the patient Mr. Andrew Fuentes will need an appointment for cardiac clearance and that multiple attempts have been made by leaving voice messages for him on his mobile number and that the house number just ring until it stops. If she has any questions to give our office a call and ask for Pre-op pool or Pre-op callback pool.

## 2021-01-10 NOTE — Telephone Encounter (Signed)
Called and left voice message for the patient on his mobile and the home numbers on file just rings.

## 2021-01-11 ENCOUNTER — Telehealth: Payer: Self-pay

## 2021-01-11 NOTE — Telephone Encounter (Signed)
Second follow up call attempt, no answer, LM

## 2021-01-11 NOTE — Telephone Encounter (Signed)
Left message on answering machine. 

## 2021-01-12 NOTE — Telephone Encounter (Signed)
I tried to reach out to the requesting office to be sure they did receive message left the other day in regards to pt upcoming procedure. Our office has made numerous attempts to reach pt though unsuccessful, to schedule a pre op appt with the cardiologist.

## 2021-01-12 NOTE — Telephone Encounter (Signed)
I did give another call to try and reach the pt to schedule a pre op appt. There is a 01/18/21 @ 10 am with Dr. Johney Frame that I was going to offer.

## 2021-01-13 NOTE — Telephone Encounter (Signed)
Have made several attempts to reach pt to schedule an appointment for surgical clearance and have been unsuccessful.    Will route back to the requesting surgeon's office to make them aware that we are unable to reach pt and they can have pt call the office if they are able to make contact with pt.   Will route back to preop pool to make them aware.

## 2021-01-16 ENCOUNTER — Encounter: Payer: Self-pay | Admitting: Gastroenterology

## 2021-03-31 ENCOUNTER — Other Ambulatory Visit: Payer: Self-pay | Admitting: Internal Medicine

## 2021-03-31 DIAGNOSIS — R079 Chest pain, unspecified: Secondary | ICD-10-CM

## 2021-04-16 ENCOUNTER — Telehealth: Payer: BC Managed Care – PPO | Admitting: Urology

## 2021-04-16 NOTE — Telephone Encounter (Signed)
Was seen February 2022 for an elevated PSA with repeat PSA persistently elevated.  Prostate biopsy was recommended but never scheduled.  Please schedule follow-up appointment for a repeat PSA/DRE.

## 2021-04-17 NOTE — Telephone Encounter (Signed)
Left message on voice mail, to call the office

## 2021-04-21 ENCOUNTER — Telehealth: Payer: Self-pay | Admitting: Cardiology

## 2021-04-21 DIAGNOSIS — Z0279 Encounter for issue of other medical certificate: Secondary | ICD-10-CM

## 2021-04-21 NOTE — Telephone Encounter (Signed)
Patient came into the office to drop off paperwork and sign authorization. Informed patient of $29 fee. HIM still waiting for payment to proceed with the process. AO 04/21/21

## 2021-05-01 NOTE — Telephone Encounter (Addendum)
Patient came into the office to make the $29 payment for his form. HIM has received the form. The form has been placed in Dr. Jacolyn Reedy box to be completed.  AO 05/01/21

## 2021-05-02 NOTE — Telephone Encounter (Signed)
-----   Message from Kyra Manges sent at 05/01/2021  4:51 PM EDT ----- Regarding: Patient Form This patient has a form that needs to be completed in Dr. Jacolyn Reedy box. Thank you.

## 2021-05-02 NOTE — Telephone Encounter (Signed)
Forms received and placed in Dr. Jacolyn Reedy folder to review and sign. Once complete, will give back to HIM to follow-up with the pt.

## 2021-05-02 NOTE — Telephone Encounter (Signed)
HIM received the completed form back from the nurse. HIM has completed the process and the form has been faxed via Epic to [facility] as requested. AO 05/02/21

## 2021-05-02 NOTE — Telephone Encounter (Signed)
Paperwork filled out by Dr. Johney Frame and given to Medical Records personnel to further follow-up with the pt on completed form.

## 2021-06-01 ENCOUNTER — Encounter (HOSPITAL_COMMUNITY): Payer: Self-pay

## 2021-06-01 ENCOUNTER — Emergency Department (HOSPITAL_COMMUNITY): Payer: No Typology Code available for payment source

## 2021-06-01 ENCOUNTER — Emergency Department (HOSPITAL_COMMUNITY)
Admission: EM | Admit: 2021-06-01 | Discharge: 2021-06-01 | Disposition: A | Discharge status: Home / Self Care | Payer: No Typology Code available for payment source | Attending: Emergency Medicine | Admitting: Emergency Medicine

## 2021-06-01 ENCOUNTER — Emergency Department (HOSPITAL_COMMUNITY): Payer: BC Managed Care – PPO

## 2021-06-01 ENCOUNTER — Other Ambulatory Visit: Payer: Self-pay

## 2021-06-01 DIAGNOSIS — S0990XA Unspecified injury of head, initial encounter: Secondary | ICD-10-CM | POA: Diagnosis present

## 2021-06-01 DIAGNOSIS — M545 Low back pain, unspecified: Secondary | ICD-10-CM

## 2021-06-01 DIAGNOSIS — I10 Essential (primary) hypertension: Secondary | ICD-10-CM | POA: Diagnosis not present

## 2021-06-01 DIAGNOSIS — S62622A Displaced fracture of medial phalanx of right middle finger, initial encounter for closed fracture: Secondary | ICD-10-CM | POA: Insufficient documentation

## 2021-06-01 DIAGNOSIS — S32018A Other fracture of first lumbar vertebra, initial encounter for closed fracture: Secondary | ICD-10-CM | POA: Diagnosis not present

## 2021-06-01 DIAGNOSIS — W19XXXA Unspecified fall, initial encounter: Secondary | ICD-10-CM

## 2021-06-01 DIAGNOSIS — M79604 Pain in right leg: Secondary | ICD-10-CM

## 2021-06-01 DIAGNOSIS — W11XXXA Fall on and from ladder, initial encounter: Secondary | ICD-10-CM | POA: Diagnosis not present

## 2021-06-01 DIAGNOSIS — Z79899 Other long term (current) drug therapy: Secondary | ICD-10-CM | POA: Insufficient documentation

## 2021-06-01 DIAGNOSIS — Z7982 Long term (current) use of aspirin: Secondary | ICD-10-CM | POA: Diagnosis not present

## 2021-06-01 DIAGNOSIS — S060X0A Concussion without loss of consciousness, initial encounter: Secondary | ICD-10-CM

## 2021-06-01 DIAGNOSIS — T07XXXA Unspecified multiple injuries, initial encounter: Secondary | ICD-10-CM

## 2021-06-01 DIAGNOSIS — S32010A Wedge compression fracture of first lumbar vertebra, initial encounter for closed fracture: Secondary | ICD-10-CM

## 2021-06-01 MED ORDER — IBUPROFEN 800 MG PO TABS
800.0000 mg | ORAL_TABLET | Freq: Once | ORAL | Status: AC
  Administered 2021-06-01: 800 mg via ORAL
  Filled 2021-06-01: qty 1

## 2021-06-01 MED ORDER — OXYCODONE-ACETAMINOPHEN 5-325 MG PO TABS: 1.0000 | ORAL_TABLET | Freq: Four times a day (QID) | ORAL | 0 refills | Status: DC | PRN

## 2021-06-01 MED ORDER — OXYCODONE-ACETAMINOPHEN 5-325 MG PO TABS
1.0000 | ORAL_TABLET | Freq: Once | ORAL | Status: AC
  Administered 2021-06-01: 1 via ORAL
  Filled 2021-06-01: qty 1

## 2021-06-01 NOTE — Progress Notes (Signed)
Orthopedic Tech Progress Note Patient Details:  Arnesh Bandt [facility] May 18, 1956 [id number]  Ortho Devices Type of Ortho Device: Ace wrap, Crutches Ortho Device/Splint Location: RLE Ortho Device/Splint Interventions: Ordered, Application, Adjustment   Post Interventions Patient Tolerated: Well  Roshelle Traub A Bennie Scaff 06/01/2021, 8:07 PM

## 2021-06-01 NOTE — Discharge Instructions (Addendum)
Please take Ibuprofen (Advil, motrin) and Tylenol (acetaminophen) to relieve your pain.    You may take up to 600 MG (3 pills) of normal strength ibuprofen every 8 hours as needed.   You make take tylenol, up to 1,000 mg (two extra strength pills) every 8 hours as needed.   It is safe to take ibuprofen and tylenol at the same time as they work differently.   Do not take more than 3,000 mg tylenol in a 24 hour period (not more than one dose every 8 hours.  Please check all medication labels as many medications such as pain and cold medications may contain tylenol.  Do not drink alcohol while taking these medications.  Do not take other NSAID'S while taking ibuprofen (such as aleve or naproxen).  Please take ibuprofen with food to decrease stomach upset.  Today you received medications that may make you sleepy or impair your ability to make decisions.  For the next 24 hours please do not drive, operate heavy machinery, care for a small child with out another adult present, or perform any activities that may cause harm to you or someone else if you were to fall asleep or be impaired.   You are being prescribed a medication which may make you sleepy. Please follow up of listed precautions for at least 24 hours after taking one dose.  

## 2021-06-01 NOTE — ED Triage Notes (Signed)
Pt states that he was on a ladder and fell approximately five feet onto concrete. C/o head, back, and L wrist and R leg pain. Pt denies LOC/nausea. NAD during triage.

## 2021-06-01 NOTE — ED Provider Notes (Signed)
[facility] EMERGENCY DEPARTMENT Provider Note   CSN: [id number] Arrival date & time: 06/01/21  0735     History Chief Complaint  Patient presents with   Andrew Fuentes    EHSAN GELLERT is a 65 y.o. male with a past medical history of hypertension, hyperlipidemia, GERD, who presents today for evaluation after a fall.  He states that this morning at about 7 AM he was about 5 to 6 feet up on a ladder and leaned over to look at something, when he did this the ladder shifted causing him to fall.  He states that his right leg got hung up on the ladder and he fell landing initially on his buttocks stating that "I fell and landed in a V shape."  He then fell backward striking his head.  He denies any loss of consciousness, he does report that he believed he saw his significant other in a window and his significant other was not actually there.  He felt well prior to the fall.  He reports his tetanus is UTD.  He reports no pain in his head or neck.  He reports pain in his right thigh.  He has not really been able to ambulate since the fall.  He states that the right thigh pain worsens with any movement.  He denies any numbness or tingling in the right leg.  No changes to bowel or bladder function, no saddle anesthesias or paresthesias.  He does report midline buttock pain and lower back pain.  He states that while in the waiting room he did take Advil and that helped with his lower back pain.  He denies any pain in his chest or abdomen.  He does have pain in his right hand primarily his right middle finger.  He states that that area is swollen and feels like there is shooting pain. He, of note, injured his left middle finger about 1 week ago when he crushed the tip of the finger between metal.   HPI     Past Medical History:  Diagnosis Date   GERD (gastroesophageal reflux disease)    Hyperlipidemia    Hypertension    MRSA (methicillin resistant staph aureus) culture positive 2019     Patient Active Problem List   Diagnosis Date Noted   Elevated PSA 10/12/2020   Pure hypercholesterolemia 10/07/2020   Primary hypertension 10/07/2020   Stable angina ([place]) 10/07/2020    Past Surgical History:  Procedure Laterality Date   COLONOSCOPY  2011   [id number], TA x1   LEFT HEART CATH AND CORONARY ANGIOGRAPHY N/A 10/10/2020   Procedure: LEFT HEART CATH AND CORONARY ANGIOGRAPHY;  Surgeon: Jettie Booze, MD;  Location: [facility];  Service: Cardiovascular;  Laterality: N/A;   POLYPECTOMY  2011   TA       Family History  Problem Relation Age of Onset   Colon cancer Neg Hx    Colon polyps Neg Hx    Esophageal cancer Neg Hx    Rectal cancer Neg Hx    Stomach cancer Neg Hx     Social History   Tobacco Use   Smoking status: Never   Smokeless tobacco: Never  Vaping Use   Vaping Use: Never used  Substance Use Topics   Alcohol use: Yes    Comment: less than monthly   Drug use: Never    Home Medications Prior to Admission medications   Medication Sig Start Date End Date Taking? Authorizing Provider  aspirin EC  81 MG tablet Take 1 tablet (81 mg total) by mouth daily. Swallow whole. 10/03/20  Yes Freada Bergeron, MD  Ibuprofen 200 MG CAPS Take 2 capsules by mouth every 6 (six) hours as needed (pain).   Yes [provider]  losartan (COZAAR) 25 MG tablet Take 1 tablet (25 mg total) by mouth daily. 10/03/20  Yes Freada Bergeron, MD  metoprolol succinate (TOPROL XL) 25 MG 24 hr tablet Take 1 tablet (25 mg total) by mouth daily. 10/03/20  Yes Freada Bergeron, MD  Multiple Vitamins-Minerals (MENS MULTIPLUS PO) Take by mouth.   Yes [provider]  Omega-3 1000 MG CAPS Take 2 capsules by mouth daily.   Yes [provider]  oxyCODONE-acetaminophen (PERCOCET/ROXICET) 5-325 MG tablet Take 1 tablet by mouth every 6 (six) hours as needed for severe pain. 06/01/21  Yes Lorin Glass, PA-C  rosuvastatin (CRESTOR) 40  MG tablet Take 1 tablet (40 mg total) by mouth daily. 10/12/20  Yes Nicolette Bang, MD  Turmeric 400 MG CAPS Take 2 tablets by mouth daily.   Yes [provider]  VITAMIN D, CHOLECALCIFEROL, PO Take by mouth.   Yes [provider]  Zinc 50 MG TABS Take by mouth.   Yes [provider]  pantoprazole (PROTONIX) 40 MG tablet TAKE 1 TABLET BY MOUTH EVERY DAY Patient not taking: No sig reported 04/06/21   Camillia Herter, NP  triamcinolone (KENALOG) 0.1 % Apply 1 application topically 2 (two) times daily. Patient not taking: No sig reported 10/07/20   Nicolette Bang, MD    Allergies    Wound dressing adhesive  Review of Systems   Review of Systems  Constitutional:  Negative for chills and fever.  HENT:  Negative for congestion.   Respiratory:  Negative for chest tightness and shortness of breath.   Cardiovascular:  Negative for chest pain.  Gastrointestinal:  Negative for abdominal pain, nausea and vomiting.  Genitourinary:  Negative for flank pain.  Musculoskeletal:  Positive for back pain. Negative for neck pain.  Skin:  Positive for wound. Negative for color change.  Neurological:  Positive for dizziness. Negative for seizures, speech difficulty, weakness and headaches.  Psychiatric/Behavioral:  Negative for confusion.   All other systems reviewed and are negative.  Physical Exam Updated Vital Signs BP 138/86   Pulse 64   Temp 98.4 F (36.9 C) (Oral)   Resp 16   Ht 6' (1.829 m)   Wt 99.8 kg   SpO2 99%   BMI 29.84 kg/m   Physical Exam Vitals and nursing note reviewed.  Constitutional:      General: He is not in acute distress.    Appearance: He is not diaphoretic.  HENT:     Head: Normocephalic and atraumatic.  Eyes:     General: No scleral icterus.       Right eye: No discharge.        Left eye: No discharge.     Conjunctiva/sclera: Conjunctivae normal.  Cardiovascular:     Rate and Rhythm: Normal rate and regular  rhythm.     Heart sounds: Normal heart sounds.  Pulmonary:     Effort: Pulmonary effort is normal. No respiratory distress.     Breath sounds: No stridor.     Comments: Chest is nontender to palpation, no crepitus or deformities noted. Abdominal:     General: There is no distension.     Tenderness: There is no abdominal tenderness. There is no guarding or  rebound.  Musculoskeletal:        General: No deformity.     Cervical back: Normal range of motion. No tenderness.     Comments: Edema over the right long finger with tenderness with flexion and extension.  There is no tenderness through the remainder of the right hand or wrist.  No tenderness with range of motion of bilateral arms.  There is no edema or tenderness over the left distal long finger. There is mild tenderness over the distal right posterior upper leg near the knee however tenderness does not extend below the knee.  This is worse with any movement. Compartments in bilateral arms and legs are soft and easily compressible.  Lower T/L-spine and sacrum have midline tenderness to palpation, worse over the mid to lower sacrum.  There is mild tenderness over the lower C/upper T-spine.  Skin:    General: Skin is warm and dry.     Comments: Abrasion present multiple sites, primarily in right elbow, superficial laceration across hands, all sub CM and not requiring sutures.   Neurological:     Mental Status: He is alert.     Motor: No abnormal muscle tone.     Comments: Patient is awake and alert.  Speech is not slurred.  He is oriented to person, place, and time.  Psychiatric:        Behavior: Behavior normal.    ED Results / Procedures / Treatments   Labs (all labs ordered are listed, but only abnormal results are displayed) Labs Reviewed - No data to display  EKG None  Radiology DG Thoracic Spine 2 View  Result Date: 06/01/2021 CLINICAL DATA:  Thoracic spine pain after fall from ladder. EXAM: THORACIC SPINE 2 VIEWS  COMPARISON:  None. FINDINGS: There is no evidence of thoracic spine fracture. Alignment is normal. Osteophyte formation is noted at multiple levels in the midthoracic spine. IMPRESSION: Multilevel degenerative change.  No acute abnormality seen. Electronically Signed   By: Marijo Conception M.D.   On: 06/01/2021 16:59   DG Lumbar Spine Complete  Result Date: 06/01/2021 CLINICAL DATA:  Fall from ladder EXAM: [place] 4+ VIEW COMPARISON:  None. FINDINGS: Mild buckling of the anterior cortex at L1 with depression of the superior endplate. Approximately 10% loss vertebral body height this level. No retropulsion. Oblique projection demonstrates pars defects at L4. 9 mm anterolisthesis of L4 on L5. IMPRESSION: 1. Mild compression fracture at L1 with depression of the superior endplate. 2. 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 Electronically Signed   By: Suzy Bouchard M.D.   On: 06/01/2021 17:00   DG Pelvis 1-2 Views  Result Date: 06/01/2021 CLINICAL DATA:  [place] from ladder EXAM: PELVIS - 1-2 VIEW COMPARISON:  None. FINDINGS: No evidence of pelvic fracture.  Lower lumbar degenerative changes. IMPRESSION: Negative. Electronically Signed   By: Nelson Chimes M.D.   On: 06/01/2021 17:22   DG Sacrum/Coccyx  Result Date: 06/01/2021 CLINICAL DATA:  Sacral pain after fall from ladder. EXAM: SACRUM AND COCCYX - 2+ VIEW COMPARISON:  None. FINDINGS: There is no evidence of fracture or other focal bone lesions. IMPRESSION: Negative. Electronically Signed   By: Marijo Conception M.D.   On: 06/01/2021 16:57   CT Head Wo Contrast  Result Date: 06/01/2021 CLINICAL DATA:  [place] from ladder.  Head trauma. EXAM: CT HEAD WITHOUT CONTRAST TECHNIQUE: Contiguous axial images were obtained from the base of the skull through the vertex without  intravenous contrast. COMPARISON:  None. FINDINGS: Brain: The brain shows a normal appearance without evidence of malformation, atrophy, old or  acute small or large vessel infarction, mass lesion, hemorrhage, hydrocephalus or extra-axial collection. Vascular: No hyperdense vessel. No evidence of atherosclerotic calcification. Skull: Normal.  No traumatic finding.  No focal bone lesion. Sinuses/Orbits: Sinuses are clear. Orbits appear normal. Mastoids are clear. Other: None significant IMPRESSION: Normal head CT. Electronically Signed   By: Nelson Chimes M.D.   On: 06/01/2021 17:23   CT Cervical Spine Wo Contrast  Result Date: 06/01/2021 CLINICAL DATA:  [place] from ladder.  Trauma to the head and neck. EXAM: CT CERVICAL SPINE WITHOUT CONTRAST TECHNIQUE: Multidetector CT imaging of the cervical spine was performed without intravenous contrast. Multiplanar CT image reconstructions were also generated. COMPARISON:  None. FINDINGS: Alignment: Normal Skull base and vertebrae: No fracture or focal bone finding. Soft tissues and spinal canal: No evidence of soft tissue injury by CT. Disc levels: Mild midcervical spondylosis but without appreciable bony stenosis of the canal or foramina. No facet arthropathy. Upper chest: Normal Other: None IMPRESSION: No acute or traumatic finding. Ordinary mild cervical degenerative changes. Electronically Signed   By: Nelson Chimes M.D.   On: 06/01/2021 17:24   DG Hand 2 View Right  Result Date: 06/01/2021 CLINICAL DATA:  65 year old male status post fall from ladder this morning. Pain and laceration. EXAM: RIGHT HAND - 2 VIEW COMPARISON:  None. FINDINGS: Bone mineralization is within normal limits for age. There is no evidence of fracture or dislocation. Joint spaces and alignment are within normal limits for age, with some IP joint osteoarthritis. No discrete soft tissue injury. IMPRESSION: No acute fracture or dislocation identified about the right hand. Electronically Signed   By: Genevie Ann M.D.   On: 06/01/2021 08:45   DG Hand 2 View Left  Result Date: 06/01/2021 CLINICAL DATA:  65 year old male status post fall from  ladder this morning. Pain and laceration. EXAM: LEFT HAND - 2 VIEW COMPARISON:  None. FINDINGS: Previous distal radius ORIF. The hardware appears intact. Chronic ulnar styloid fracture. On the lateral view there is cortical irregularity and lucency along the dorsal distal radius, although no significant overlying soft tissue swelling. Radiocarpal joint space loss, subchondral sclerosis and osteophytosis. No acute carpal bone fracture identified. Metacarpals appear intact. There is an oblique fracture through the tuft of the 3rd distal phalanx, minimally displaced. There is soft tissue swelling throughout that phalanx. Other phalanges appear intact and normally aligned. IMPRESSION: 1. Oblique minimally displaced fracture through the tuft of the 3rd distal phalanx. Associated soft tissue swelling. 2. Previous distal radius ORIF and distal ulnar fractures. Cortical irregularity and lucency along the dorsal distal radius is favored to be incompletely healed fracture, as absence of overlying soft tissue swelling argues against a new fracture. 3. Chronic radiocarpal joint degeneration. Electronically Signed   By: Genevie Ann M.D.   On: 06/01/2021 08:51   DG Femur Min 2 Views Right  Result Date: 06/01/2021 CLINICAL DATA:  [place] from a ladder. EXAM: RIGHT FEMUR 2 VIEWS COMPARISON:  None. FINDINGS: The right hip is normally located. No definite fracture. The right femur is intact. No femur fracture. The knee joint is maintained. IMPRESSION: No acute bony findings. Electronically Signed   By: Marijo Sanes M.D.   On: 06/01/2021 16:57    Procedures Procedures   Medications Ordered in ED Medications  ibuprofen (ADVIL) tablet 800 mg (has no administration in time range)  oxyCODONE-acetaminophen (PERCOCET/ROXICET) 5-325 MG per  tablet 1 tablet (has no administration in time range)    ED Course  I have reviewed the triage vital signs and the nursing notes.  Pertinent labs & imaging results that were available during my  care of the patient were reviewed by me and considered in my medical decision making (see chart for details).    MDM Rules/Calculators/A&P                         Patient is a 65 year old man who presents today for evaluation of a fall that he reports occurred at work off of a ladder.  He has multiple areas of abrasions and superficial lacerations.  None of his lacerations require primary suture repair. His fall was nonsyncopal.  He did not meet trauma criteria and is not anticoagulated at baseline.  He did have a episode of confusion/hallucinations briefly after the fall. CT head and neck is obtained without acute abnormalities.  Clinically suspect a concussion.  He is given concussion precautions and instructions along with concussion follow-up. There is no tenderness over chest or abdomen anteriorly, he is observed in the emergency room for a total of 12 hours prior to discharge without worsening of condition.  He is 99% on room air. He does have a pain in his left wrist, he has a prior ORIF there, x-ray does not show acute fracture in that wrist especially given lack of swelling, he does not have localized tenderness over the dorsal distal radius where the irregularity was noted. He does have a fracture through the tuft of the right distal third phalanx which patient states is from a injury about a week ago when he got the finger crushed.  This does not appear to be open.  He is given a splint for this and recommended outpatient follow-up.  This area is not painful today on my exam.  He does have swelling around his right third finger.  He is given a splint for this.  Wounds were cleaned and dressed by staff.  His tetanus is up-to-date per chart review within the past 5 years. X-rays of the right femur, the pelvis, sacrum and coccyx along with T and L-spine imaging is obtained due to areas of pain.  The x-rays of the L-spine do show a 10% L1 compression fracture, patient does not have significant  tenderness in this area.  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    I discussed TLSO with patient who declines at this time.  He is able to ambulate, primary area of pain is in the right posterior upper leg.  This area is without fracture, he is able to ambulate without pain, given pain medicine, Ace wrap and crutches. 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    Return precautions were discussed with patient who states their understanding.  At the time of discharge patient denied any unaddressed complaints or concerns.  Patient is agreeable for discharge home.  Note: Portions of this report may have been transcribed using voice recognition software. Every effort was made to ensure accuracy; however, inadvertent computerized transcription errors may be present  This patient was seen as a shared visit with Dr. Darl Householder.   Note: Portions of this report may  have been transcribed using voice recognition software. Every effort was made to ensure accuracy; however, inadvertent computerized transcription errors may be present   Final Clinical Impression(s) / ED Diagnoses Final diagnoses:  Fall, initial encounter  Concussion without loss of consciousness, initial encounter  Fall from ladder, initial encounter  Compression fracture of L1 vertebra, initial encounter ([place])  Abrasions of multiple sites  Right leg pain  Acute midline low back pain without sciatica    Rx / DC Orders ED Discharge Orders          Ordered    oxyCODONE-acetaminophen (PERCOCET/ROXICET) 5-325 MG tablet  Every 6 hours PRN        06/01/21 1922             Lorin Glass, [place] 06/01/21 1959    Drenda Freeze, MD 06/01/21 2317

## 2021-06-01 NOTE — ED Notes (Signed)
Patient transported to CT 

## 2021-07-02 ENCOUNTER — Telehealth: Payer: Self-pay | Admitting: Urology

## 2021-07-02 NOTE — Telephone Encounter (Signed)
I sent a telephone message June 2022 recommending scheduling prostate biopsy due to persistently elevated PSA.  He was left a MyChart message to call the office and never contacted.  Please reach out to him again and if unable to talk to him personally send message back to me and will have a certified letter sent.

## 2021-07-03 ENCOUNTER — Encounter: Payer: Self-pay | Admitting: *Deleted

## 2021-07-03 ENCOUNTER — Telehealth: Payer: Self-pay | Admitting: *Deleted

## 2021-07-03 NOTE — Telephone Encounter (Signed)
Sent cardiac clearance to heart dr. Kathlen Brunswick call patient to scheduled

## 2021-07-03 NOTE — Telephone Encounter (Signed)
Left message

## 2021-07-03 NOTE — Telephone Encounter (Signed)
Looks like we tried to get pt scheduled back in March of this year for the same.  I went ahead and contacted pt and he is scheduled to see Robbie Lis, PA-C this Wednesday, 07/05/21, for clearance.  Will route back to the requesting surgeon's office to make them aware.        [facility] Pre-operative Risk Assessment    HEARTCARE STAFF: - Please ensure there is not already an duplicate clearance open for this procedure. - Under Visit Info/Reason for Call, type in Other and utilize the format Clearance MM/DD/YY or Clearance TBD. Do not use dashes or single digits. - If request is for dental extraction, please clarify the # of teeth to be extracted.   Request for surgical clearance:   What type of surgery is being performed?  PROSTATE BIOPSY    When is this surgery scheduled?  TBD    What type of clearance is required (medical clearance vs. Pharmacy clearance to hold med vs. Both)?  BOTH   Are there any medications that need to be held prior to surgery and how long? ASPIRIN 7 DAYS    Practice name and name of physician performing surgery?  [facility]    What is the office phone number?  [phone]   7.   What is the office fax number?  [phone]   8.   Anesthesia type (None, local, MAC, general) ?

## 2021-07-05 ENCOUNTER — Other Ambulatory Visit: Payer: Self-pay

## 2021-07-05 ENCOUNTER — Ambulatory Visit (INDEPENDENT_AMBULATORY_CARE_PROVIDER_SITE_OTHER): Payer: BC Managed Care – PPO | Admitting: Physician Assistant

## 2021-07-05 ENCOUNTER — Encounter: Payer: Self-pay | Admitting: Physician Assistant

## 2021-07-05 VITALS — BP 114/80 | HR 65 | Ht 71.0 in | Wt 230.2 lb

## 2021-07-05 DIAGNOSIS — I2510 Atherosclerotic heart disease of native coronary artery without angina pectoris: Secondary | ICD-10-CM

## 2021-07-05 DIAGNOSIS — Z01818 Encounter for other preprocedural examination: Secondary | ICD-10-CM

## 2021-07-05 DIAGNOSIS — Z01810 Encounter for preprocedural cardiovascular examination: Secondary | ICD-10-CM | POA: Diagnosis not present

## 2021-07-05 DIAGNOSIS — E7800 Pure hypercholesterolemia, unspecified: Secondary | ICD-10-CM

## 2021-07-05 DIAGNOSIS — R972 Elevated prostate specific antigen [PSA]: Secondary | ICD-10-CM

## 2021-07-05 DIAGNOSIS — I10 Essential (primary) hypertension: Secondary | ICD-10-CM

## 2021-07-05 MED ORDER — ROSUVASTATIN CALCIUM 40 MG PO TABS: 40.0000 mg | ORAL_TABLET | Freq: Every day | ORAL | 1 refills | Status: DC

## 2021-07-05 NOTE — Patient Instructions (Signed)
Medication Instructions:  Your physician recommends that you continue on your current medications as directed. Please refer to the Current Medication list given to you today.  *If you need a refill on your cardiac medications before your next appointment, please call your pharmacy*   Lab Work: COME FASTING FOR LIPID & LFT, ANY MORNING AFTER 7:30 A.M  If you have labs (blood work) drawn today and your tests are completely normal, you will receive your results only by: [employer] (if you have MyChart) OR A paper copy in the mail If you have any lab test that is abnormal or we need to change your treatment, we will call you to review the results.   Testing/Procedures: None ordered   Follow-Up: At [facility], you and your health needs are our priority.  As part of our continuing mission to provide you with exceptional heart care, we have created designated Provider Care Teams.  These Care Teams include your primary Cardiologist (physician) and Advanced Practice Providers (APPs -  Physician Assistants and Nurse Practitioners) who all work together to provide you with the care you need, when you need it.  We recommend signing up for the patient portal called "MyChart".  Sign up information is provided on this After Visit Summary.  MyChart is used to connect with patients for Virtual Visits (Telemedicine).  Patients are able to view lab/test results, encounter notes, upcoming appointments, etc.  Non-urgent messages can be sent to your provider as well.   To learn more about what you can do with MyChart, go to [email or web address].    Your next appointment:   12 month(s)  The format for your next appointment:   In Person  Provider:   Gwyndolyn Kaufman, MD   Other Instructions

## 2021-07-05 NOTE — Progress Notes (Signed)
Cardiology Office Note:    Date:  07/05/2021   ID:  Andrew Fuentes, DOB Jun 11, 1956, MRN [id number]  PCP:  Pcp, No  [facility] Cardiologist:  Freada Bergeron, MD  [place] Electrophysiologist:  None   Chief Complaint: pre-op clearance for prostate biopsy   History of Present Illness:    Andrew Fuentes is a 65 y.o. male with a hx of with history of CAD, hypertension and hyperlipidemia presents for surgical clearance.  Establish care with Dr. Johney Frame December 2021 for evaluation of chest pain.  Symptoms were concerning for stable angina.  Follow-up cardiac catheterization by Dr. Irish Lack October 10, 2020 showed diffuse, distal vessel and branch vessel disease.  Areas of stenosis are relatively small and not good candidates for PCI.  Recommended medical therapy.  If refractory angina could consider PCI of first diagonal CTO.  Echo 10/2020 showed LVEF of 60-65% and grade 1 DD.   Here today for surgical clearance.  Patient was very active up until a month ago when fall from ladder.  Currently working to get disability and on restriction activity.  He was a Geologist, engineering.  He denies chest pain, shortness of breath, orthopnea, PND, syncope, lower extremity edema or melena.  Past Medical History:  Diagnosis Date   GERD (gastroesophageal reflux disease)    Hyperlipidemia    Hypertension    MRSA (methicillin resistant staph aureus) culture positive 2019    Past Surgical History:  Procedure Laterality Date   COLONOSCOPY  2011   [id number], TA x1   LEFT HEART CATH AND CORONARY ANGIOGRAPHY N/A 10/10/2020   Procedure: LEFT HEART CATH AND CORONARY ANGIOGRAPHY;  Surgeon: Andrew Booze, MD;  Location: [facility];  Service: Cardiovascular;  Laterality: N/A;   POLYPECTOMY  2011   TA    Current Medications: Current Meds  Medication Sig   aspirin EC 81 MG tablet Take 1 tablet (81 mg total) by mouth daily. Swallow whole.   Ibuprofen 200 MG CAPS Take 2  capsules by mouth every 6 (six) hours as needed (pain).   losartan (COZAAR) 25 MG tablet Take 1 tablet (25 mg total) by mouth daily.   metoprolol succinate (TOPROL XL) 25 MG 24 hr tablet Take 1 tablet (25 mg total) by mouth daily.   Multiple Vitamins-Minerals (MENS MULTIPLUS PO) Take by mouth.   Omega-3 1000 MG CAPS Take 2 capsules by mouth daily.   Turmeric 400 MG CAPS Take 2 tablets by mouth daily.   VITAMIN D, CHOLECALCIFEROL, PO Take by mouth.   Zinc 50 MG TABS Take by mouth.   [DISCONTINUED] rosuvastatin (CRESTOR) 40 MG tablet Take 1 tablet (40 mg total) by mouth daily.     Allergies:   Wound dressing adhesive   Social History   Socioeconomic History   Marital status: Married    Spouse name: Not on file   Number of children: Not on file   Years of education: Not on file   Highest education level: Not on file  Occupational History   Not on file  Tobacco Use   Smoking status: Never   Smokeless tobacco: Never  Vaping Use   Vaping Use: Never used  Substance and Sexual Activity   Alcohol use: Yes    Comment: less than monthly   Drug use: Never   Sexual activity: Not on file  Other Topics Concern   Not on file  Social History Narrative   Not on file   Social Determinants of Health  Financial Resource Strain: Not on file  Food Insecurity: Not on file  Transportation Needs: Not on file  Physical Activity: Not on file  Stress: Not on file  Social Connections: Not on file     Family History: The patient's family history is negative for Colon cancer, Colon polyps, Esophageal cancer, Rectal cancer, and Stomach cancer.    ROS:   Please see the history of present illness.    All other systems reviewed and are negative.   EKGs/Labs/Other Studies Reviewed:    The following studies were reviewed today:  Echo 10/2020 IMPRESSIONS     1. Left ventricular ejection fraction, by estimation, is 60 to 65%. The  left ventricle has normal function. The left ventricle has no  regional  wall motion abnormalities. Left ventricular diastolic parameters are  consistent with Grade I diastolic  dysfunction (impaired relaxation).   2. Right ventricular systolic function is normal. The right ventricular  size is normal. Tricuspid regurgitation signal is inadequate for assessing  PA pressure.   3. Left atrial size was mild to moderately dilated.   4. The mitral valve is normal in structure. Trivial mitral valve  regurgitation.   5. The aortic valve is grossly normal. Aortic valve regurgitation is not  visualized. No aortic stenosis is present.   Comparison(s): No prior Echocardiogram.   Conclusion(s)/Recommendation(s): Normal biventricular function without  evidence of hemodynamically significant valvular heart disease.   LEFT HEART CATH AND CORONARY ANGIOGRAPHY   Conclusion    RPDA lesion is 75% stenosed. RPAV lesion is 80% stenosed. 1st Diag-1 lesion is 75% stenosed. 1st Diag-2 lesion is 100% stenosed. 3rd Mrg lesion is 70% stenosed. Dist LAD lesion is 90% stenosed. The left ventricular systolic function is normal. LV end diastolic pressure is normal. The left ventricular ejection fraction is 55-65% by visual estimate. There is no aortic valve stenosis.   Diffuse, distal vessel and branch vessel disease.  Areas of stenosis are relatively small and not good candidates for PCI.    Plan for medical therapy.  If he had refractory angina, could consider PCI of first diagonal CTO.     Due to difficulty engaging RCA, if repeat cath was done, would use right groin approach.     Will keep him out of work for a week since the end of the week is a holiday.  Diagnostic Dominance: Right    EKG:  EKG is  ordered today.  The ekg ordered today demonstrates normal sinus rhythm  Recent Labs: 10/03/2020: Hemoglobin 14.5; Platelets 240 10/11/2020: BUN 19; Creatinine, Ser 1.04; Potassium 4.7; Sodium 137  Recent Lipid Panel    Component Value Date/Time   CHOL  218 (H) 10/07/2020 0907   TRIG 84 10/07/2020 0907   HDL 46 10/07/2020 0907   CHOLHDL 4.7 10/07/2020 0907   CHOLHDL 5 11/22/2009 1539   VLDL 18.4 11/22/2009 1539   LDLCALC 157 (H) 10/07/2020 0907   LDLDIRECT 182.4 11/22/2009 1539    Physical Exam:    VS:  BP 114/80   Pulse 65   Ht 5' 11"  (1.803 m)   Wt 230 lb 3.2 oz (104.4 kg)   SpO2 95%   BMI 32.11 kg/m     Wt Readings from Last 3 Encounters:  07/05/21 230 lb 3.2 oz (104.4 kg)  06/01/21 220 lb (99.8 kg)  01/09/21 217 lb (98.4 kg)     GEN:  Well nourished, well developed in no acute distress HEENT: Normal NECK: No JVD; No carotid bruits LYMPHATICS: No lymphadenopathy  CARDIAC: RRR, no murmurs, rubs, gallops RESPIRATORY:  Clear to auscultation without rales, wheezing or rhonchi  ABDOMEN: Soft, non-tender, non-distended MUSCULOSKELETAL:  No edema; No deformity  SKIN: Warm and dry NEUROLOGIC:  Alert and oriented x 3 PSYCHIATRIC:  Normal affect   ASSESSMENT AND PLAN:    CAD - Cath 09/2020 showed distal diffuse disease >> recommended medical therapy -No angina -Continue aspirin, statin, beta-blocker and ARB  2. HTN -Blood pressure stable and controlled on current medication.  No change.  3.HLD -Due for lab.  Will update.  Continue statin.  4. Surgical clearance  -Patient is very active at baseline and easily getting at least 4 METS of activity without any issue. . Given past medical history and time since last visit, based on ACC/AHA guidelines, HATIM HOMANN would be at acceptable risk for the planned procedure without further cardiovascular testing. Okay to hold ASA 7 days prior.   The patient was advised that if he develops new symptoms prior to surgery to contact our office to arrange for a follow-up visit, and he verbalized understanding.  I will route this recommendation to the requesting party via Epic fax function and remove from pre-op pool.  Please call with questions.   Medication  Adjustments/Labs and Tests Ordered: Current medicines are reviewed at length with the patient today.  Concerns regarding medicines are outlined above.  Orders Placed This Encounter  Procedures   Lipid panel   Comp Met (CMET)   EKG 12-Lead   Meds ordered this encounter  Medications   rosuvastatin (CRESTOR) 40 MG tablet    Sig: Take 1 tablet (40 mg total) by mouth daily.    Dispense:  90 tablet    Refill:  1    Patient Instructions  Medication Instructions:  Your physician recommends that you continue on your current medications as directed. Please refer to the Current Medication list given to you today.  *If you need a refill on your cardiac medications before your next appointment, please call your pharmacy*   Lab Work: COME FASTING FOR LIPID & LFT, ANY MORNING AFTER 7:30 A.M  If you have labs (blood work) drawn today and your tests are completely normal, you will receive your results only by: [place] (if you have MyChart) OR A paper copy in the mail If you have any lab test that is abnormal or we need to change your treatment, we will call you to review the results.   Testing/Procedures: None ordered   Follow-Up: At [facility], you and your health needs are our priority.  As part of our continuing mission to provide you with exceptional heart care, we have created designated Provider Care Teams.  These Care Teams include your primary Cardiologist (physician) and Advanced Practice Providers (APPs -  Physician Assistants and Nurse Practitioners) who all work together to provide you with the care you need, when you need it.  We recommend signing up for the patient portal called "MyChart".  Sign up information is provided on this After Visit Summary.  MyChart is used to connect with patients for Virtual Visits (Telemedicine).  Patients are able to view lab/test results, encounter notes, upcoming appointments, etc.  Non-urgent messages can be sent to your provider as  well.   To learn more about what you can do with MyChart, go to [email or web address].    Your next appointment:   12 month(s)  The format for your next appointment:   In Person  Provider:   Gwyndolyn Kaufman, MD  Other Instructions    Signed, Leanor Kail, [place]  07/05/2021 2:27 PM    [facility]

## 2021-07-07 ENCOUNTER — Other Ambulatory Visit: Payer: BC Managed Care – PPO

## 2021-07-07 LAB — COMPREHENSIVE METABOLIC PANEL
ALT: 24 IU/L (ref 0–44)
AST: 26 IU/L (ref 0–40)
Albumin/Globulin Ratio: 1 — ABNORMAL LOW (ref 1.2–2.2)
Albumin: 4 g/dL (ref 3.8–4.8)
Alkaline Phosphatase: 84 IU/L (ref 44–121)
BUN/Creatinine Ratio: 19 (ref 10–24)
BUN: 22 mg/dL (ref 8–27)
Bilirubin Total: 0.6 mg/dL (ref 0.0–1.2)
CO2: 21 mmol/L (ref 20–29)
Calcium: 8.6 mg/dL (ref 8.6–10.2)
Chloride: 102 mmol/L (ref 96–106)
Creatinine, Ser: 1.13 mg/dL (ref 0.76–1.27)
Globulin, Total: 3.9 g/dL (ref 1.5–4.5)
Glucose: 95 mg/dL (ref 65–99)
Potassium: 4.1 mmol/L (ref 3.5–5.2)
Sodium: 137 mmol/L (ref 134–144)
Total Protein: 7.9 g/dL (ref 6.0–8.5)
eGFR: 72 mL/min/{1.73_m2} (ref >59)

## 2021-07-07 LAB — LIPID PANEL
Chol/HDL Ratio: 3 ratio (ref 0.0–5.0)
Cholesterol, Total: 137 mg/dL (ref 100–199)
HDL: 45 mg/dL (ref >39)
LDL Chol Calc (NIH): 69 mg/dL (ref 0–99)
Triglycerides: 131 mg/dL (ref 0–149)
VLDL Cholesterol Cal: 23 mg/dL (ref 5–40)

## 2021-07-07 NOTE — Addendum Note (Signed)
Addended by: Eulis Foster on: 07/07/2021 07:38 AM   Modules accepted: Orders

## 2021-07-21 ENCOUNTER — Other Ambulatory Visit: Payer: Self-pay

## 2021-07-21 ENCOUNTER — Ambulatory Visit (INDEPENDENT_AMBULATORY_CARE_PROVIDER_SITE_OTHER): Payer: BC Managed Care – PPO | Admitting: Urology

## 2021-07-21 ENCOUNTER — Encounter: Payer: Self-pay | Admitting: Urology

## 2021-07-21 VITALS — BP 170/94 | HR 58 | Ht 71.0 in | Wt 234.8 lb

## 2021-07-21 DIAGNOSIS — R972 Elevated prostate specific antigen [PSA]: Secondary | ICD-10-CM

## 2021-07-21 DIAGNOSIS — C61 Malignant neoplasm of prostate: Secondary | ICD-10-CM

## 2021-07-21 HISTORY — PX: PROSTATE BIOPSY: SHX241

## 2021-07-21 MED ORDER — LEVOFLOXACIN 500 MG PO TABS
500.0000 mg | ORAL_TABLET | Freq: Once | ORAL | Status: AC
  Administered 2021-07-21: 500 mg via ORAL

## 2021-07-21 MED ORDER — GENTAMICIN SULFATE 40 MG/ML IJ SOLN
80.0000 mg | Freq: Once | INTRAMUSCULAR | Status: AC
  Administered 2021-07-21: 80 mg via INTRAMUSCULAR

## 2021-07-21 NOTE — Patient Instructions (Addendum)
Transrectal Prostate Biopsy Patient Education and Post Procedure Instructions    -Definition A prostate biopsy is the removal of a small amount of tissue from the prostate gland. The tissue is examined to determine whether there is cancer.  -Reasons for Procedure A prostate biopsy is usually done after an abnormal finding by: Digital rectal exam Prostate specific antigen (PSA) blood test A prostate biopsy is the only way to find out if cancer cells are present.  -Possible Complications Problems from the procedure are rare, but all procedures have some risk including: Infection Bruising or lengthy bleeding from the rectum, or in urine or semen Difficulty urinating Reactions to anesthesia Factors that may increase the risk of complications include: Smoking History of bleeding disorders or easy bruising Use of any medications, over-the-counter medications, or herbal supplements Sensitivity or allergy to latex, medications, or anesthesia.  -Prior to Procedure Talk to your doctor about your medications. Blood thinning medications including aspirin should be stopped 1 week prior to procedure. If prescribed by your cardiologist we may need approval before stopping medications. Use a Fleets enema 2 hours before the procedure. Can be purchased at your pharmacy. Antibiotics will be administered in the clinic prior to procedure.  Please make sure you eat a light meal prior to coming in for your appointment. This can help prevent lightheadedness during the procedure and upset stomach from antibiotics. Please bring someone with you to the procedure to drive you home.  -Anesthesia Transrectal biopsy: Local anesthesia--Just the area that is being operated on is numbed using an injectable anesthetic.  -Description of the Procedure Transrectal biopsy--Your doctor will insert a small ultrasound device into the rectum. This device will produce sound waves to create an image of the prostate.  These images will help guide placement of the needle. Your doctor will then insert the needle through the wall of the rectum and into the prostate gland. The procedure should take approximately 15-30 minutes.  -Will It Hurt? You may have discomfort and soreness at the biopsy site. Pain and discomfort after the procedure can be managed with medications.  -Postoperative Care When you return home after the procedure, do the following to help ensure a smooth recovery: Stay hydrated. Drink plenty of fluids for the next few days. Avoid difficult physical activity the day and evening of the procedure. Keep in mind that you may see blood in your urine, stool, or semen for several days. Resume any medications that were stopped when you are advised to do so.  After the sample is taken, it will be sent to a pathologist for examination under a microscope. This doctor will analyze the sample for cancer. You will be scheduled for an appointment to discuss results. If cancer is present, your doctor will work with you to develop a treatment plan.   -Call Your Doctor or Seek Immediate Medical Attention It is important to monitor your recovery. Alert your doctor to any problems. If any of the following occur, call your doctor or go to the emergency room: Fever 100.5 or greater within 1 week post procedure go directly to ER Call the office for: Blood in the urine more than 1 week or in semen for more than 6 weeks post-biopsy Pain that you cannot control with the medications you have been given Pain, burning, urgency, or frequency of urination Cough, shortness of breath, or chest pain- if severe go to ER Heavy rectal bleeding or bleeding that lasts more than 1 week after the biopsy If you  have any questions or concerns please contact our office at ([place] [address] [place],  [address] [phone]    Transrectal Ultrasound-Guided  Prostate Biopsy, Care After This sheet gives you information about how to care for yourself after your procedure. Your doctor may also give you more specific instructions. If you have problems or questions, contact your doctor. What can I expect after the procedure? After the procedure, it is common to have: Pain and discomfort in your butt, especially while sitting. Pink-colored pee (urine), due to small amounts of blood in the pee. Burning while peeing (urinating). Blood in your poop (stool). Bleeding from your butt. Blood in your semen. Follow these instructions at home: Medicines Take over-the-counter and prescription medicines only as told by your doctor. If you were prescribed antibiotic medicine, take it as told by your doctor. Do not stop taking the antibiotic even if you start to feel better. Activity  Do not drive for 24 hours if you were given a medicine to help you relax (sedative) during your procedure. Return to your normal activities as told by your doctor. Ask your doctor what activities are safe for you. Ask your doctor when it is okay for you to have sex. Do not lift anything that is heavier than 10 lb (4.5 kg), or the limit that you are told, until your doctor says that it is safe. General instructions  Drink enough water to keep your pee pale yellow. Watch your pee, poop, and semen for new bleeding or bleeding that gets worse. Keep all follow-up visits as told by your doctor. This is important. Contact a doctor if you: Have blood clots in your pee or poop. Notice that your pee smells bad or unusual. Have very bad belly pain. Have trouble peeing. Notice that your lower belly feels firm. Have blood in your pee for more than 2 weeks after the procedure. Have blood in your semen for more than 2 months after the procedure. Have problems getting an erection. Feel sick to your stomach (nauseous) or throw up (vomit). Have new or worse bleeding in your pee, poop, or  semen. Get help right away if you: Have a fever or chills. Have bright red pee. Have very bad pain that does not get better with medicine. Cannot pee. Summary After this procedure, it is common to have pain and discomfort around your butt, especially while sitting. You may have blood in your pee and poop. It is common to have blood in your semen for 1-2 months. If you were prescribed antibiotic medicine, take it as told by your doctor. Do not stop taking the antibiotic even if you start to feel better. Get help right away if you have a fever or chills. This information is not intended to replace advice given to you by your health care provider. Make sure you discuss any questions you have with your health care provider. Document Revised: 08/22/2020 Document Reviewed: 06/23/2020 Elsevier Patient Education  2022 [employer].

## 2021-07-21 NOTE — Progress Notes (Signed)
Prostate Biopsy Procedure   Informed consent was obtained after discussing risks/benefits of the procedure.  A time out was performed to ensure correct patient identity.  Pre-Procedure: - Last PSA Level: 12/26/2020-5.6 Lab Results  Component Value Date   PSA 1.73 11/22/2009   - Gentamicin given prophylactically - Levaquin 500 mg administered PO -Transrectal Ultrasound performed revealing a 35 gm prostate -No significant hypoechoic or median lobe noted  Procedure: - Prostate block performed using 10 cc 1% lidocaine and biopsies taken from sextant areas, a total of 12 under ultrasound guidance.  Post-Procedure: - Patient tolerated the procedure well - He was counseled to seek immediate medical attention if experiences any severe pain, significant bleeding, or fevers - Return in one week to discuss biopsy results   John Giovanni, MD

## 2021-07-24 LAB — SURGICAL PATHOLOGY

## 2021-07-27 ENCOUNTER — Telehealth: Payer: Self-pay | Admitting: Urology

## 2021-07-27 NOTE — Telephone Encounter (Signed)
I contacted Andrew Fuentes to discuss his prostate biopsy report.  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  All right-sided cores were positive.  We discussed this is considered intermediate risk disease.  Recommended staging imaging to include prostate MRI and bone scan. If no evidence of metastatic disease we briefly discussed curative treatment options including robotic assisted radical prostatectomy and radiation modalities.  He has an appointment scheduled tomorrow and would like to keep this appointment and have his wife accompany him.

## 2021-07-28 ENCOUNTER — Ambulatory Visit (INDEPENDENT_AMBULATORY_CARE_PROVIDER_SITE_OTHER): Payer: BC Managed Care – PPO | Admitting: Urology

## 2021-07-28 ENCOUNTER — Encounter: Payer: Self-pay | Admitting: Urology

## 2021-07-28 ENCOUNTER — Other Ambulatory Visit: Payer: Self-pay

## 2021-07-28 VITALS — BP 156/93 | HR 54 | Ht 70.0 in | Wt 230.0 lb

## 2021-07-28 DIAGNOSIS — C61 Malignant neoplasm of prostate: Secondary | ICD-10-CM | POA: Diagnosis not present

## 2021-07-28 NOTE — Progress Notes (Signed)
07/28/2021 11:26 AM   Andrew Fuentes 1956-05-05 [id number]  Referring provider: No referring provider defined for this encounter.  Chief Complaint  Patient presents with   Follow-up    Biopsy results    HPI: 65 y.o. male presents for prostate biopsy follow-up.  He was contacted yesterday regarding pathology results No postbiopsy complaints Prostate volume 35 g 6/12 cores positive Gleason 3+/4+3 adenocarcinoma; all right-sided cores were positive     PMH: Past Medical History:  Diagnosis Date   GERD (gastroesophageal reflux disease)    Hyperlipidemia    Hypertension    MRSA (methicillin resistant staph aureus) culture positive 2019    Surgical History: Past Surgical History:  Procedure Laterality Date   COLONOSCOPY  2011   [id number], TA x1   LEFT HEART CATH AND CORONARY ANGIOGRAPHY N/A 10/10/2020   Procedure: LEFT HEART CATH AND CORONARY ANGIOGRAPHY;  Surgeon: Jettie Booze, MD;  Location: [facility];  Service: Cardiovascular;  Laterality: N/A;   POLYPECTOMY  2011   TA   PROSTATE BIOPSY  07/21/2021    Home Medications:  Allergies as of 07/28/2021       Reactions   Wound Dressing Adhesive Itching        Medication List        Accurate as of July 28, 2021 11:26 AM. If you have any questions, ask your nurse or doctor.          aspirin EC 81 MG tablet Take 1 tablet (81 mg total) by mouth daily. Swallow whole.   Ibuprofen 200 MG Caps Take 2 capsules by mouth every 6 (six) hours as needed (pain).   losartan 25 MG tablet Commonly known as: COZAAR Take 1 tablet (25 mg total) by mouth daily.   MENS MULTIPLUS PO Take by mouth.   metoprolol succinate 25 MG 24 hr tablet Commonly known as: Toprol XL Take 1 tablet (25 mg total) by mouth daily.   Omega-3 1000 MG Caps Take 2 capsules by mouth daily.   rosuvastatin 40 MG tablet Commonly known as: CRESTOR Take 1 tablet (40 mg total) by mouth daily.   Turmeric 400 MG Caps Take  2 tablets by mouth daily.   VITAMIN D (CHOLECALCIFEROL) PO Take by mouth.   Zinc 50 MG Tabs Take by mouth.        Allergies:  Allergies  Allergen Reactions   Wound Dressing Adhesive Itching    Family History: Family History  Problem Relation Age of Onset   Colon cancer Neg Hx    Colon polyps Neg Hx    Esophageal cancer Neg Hx    Rectal cancer Neg Hx    Stomach cancer Neg Hx     Social History:  reports that he has never smoked. He has never used smokeless tobacco. He reports current alcohol use. He reports that he does not use drugs.   Physical Exam: BP (!) 156/93   Pulse (!) 54   Ht 5\' 10"  (1.778 m)   Wt 230 lb (104.3 kg)   BMI 33.00 kg/m   Constitutional:  Alert and oriented, No acute distress. HEENT: [place] AT, moist mucus membranes.  Trachea midline, no masses. Cardiovascular: No clubbing, cyanosis, or edema. Respiratory: Normal respiratory effort, no increased work of breathing. Psychiatric: Normal mood and affect.    Assessment & Plan:    1.  Clinical T1c Nx Mx adenocarcinoma prostate Intermediate risk (unfavorable) We discussed staging evaluation to include bone scan and prostate MRI If no evidence of  metastatic disease the most common curative treatment options were discussed including robotic assisted radical prostatectomy and radiation modalities We also discussed there is not a "best" treatment for prostate cancer and that both treatments are considered equal.  There is a higher chance of recurrence with radiation as compared to surgery after 10-15 years The most common side effects of radical prostatectomy were discussed including erectile dysfunction and urinary incontinence.  Less common side effects of rectal injury were reviewed The most common side effects of radiation were discussed including irritative voiding symptoms, hematuria, rectal bleeding.  Less common complications of urinary fistula and radiation-induced malignancies were reviewed Orders  were placed for MRI and bone scan and will call with results There were provided literature on diagnosis and treatment prostate cancer At this point he is leaning towards radiation therapy  I spent 35 total minutes on the day of the encounter including pre-visit review of the medical record, face-to-face time with the patient, and post visit ordering of labs/imaging/tests.   Abbie Sons, [place] [address] [place], [place] [address] [phone]

## 2021-08-16 ENCOUNTER — Encounter
Admission: RE | Admit: 2021-08-16 | Discharge: 2021-08-16 | Disposition: A | Discharge status: Home / Self Care | Payer: BC Managed Care – PPO | Source: Ambulatory Visit | Attending: Urology | Admitting: Urology

## 2021-08-16 ENCOUNTER — Other Ambulatory Visit: Payer: Self-pay

## 2021-08-16 DIAGNOSIS — C61 Malignant neoplasm of prostate: Secondary | ICD-10-CM | POA: Insufficient documentation

## 2021-08-16 MED ORDER — TECHNETIUM TC 99M MEDRONATE IV KIT
20.0000 | PACK | Freq: Once | INTRAVENOUS | Status: AC | PRN
  Administered 2021-08-16: 21.6 via INTRAVENOUS

## 2021-08-17 ENCOUNTER — Ambulatory Visit
Admission: RE | Admit: 2021-08-17 | Discharge: 2021-08-17 | Disposition: A | Discharge status: Home / Self Care | Payer: BC Managed Care – PPO | Source: Ambulatory Visit | Attending: Urology | Admitting: Urology

## 2021-08-17 DIAGNOSIS — C61 Malignant neoplasm of prostate: Secondary | ICD-10-CM | POA: Diagnosis not present

## 2021-08-17 MED ORDER — GADOBUTROL 1 MMOL/ML IV SOLN
10.0000 mL | Freq: Once | INTRAVENOUS | Status: AC | PRN
  Administered 2021-08-17: 10 mL via INTRAVENOUS

## 2021-08-20 ENCOUNTER — Encounter: Payer: Self-pay | Admitting: Urology

## 2021-08-21 ENCOUNTER — Other Ambulatory Visit: Payer: Self-pay | Admitting: Urology

## 2021-08-21 DIAGNOSIS — C61 Malignant neoplasm of prostate: Secondary | ICD-10-CM

## 2021-08-28 ENCOUNTER — Other Ambulatory Visit: Payer: Self-pay

## 2021-08-28 ENCOUNTER — Ambulatory Visit
Admission: RE | Admit: 2021-08-28 | Discharge: 2021-08-28 | Disposition: A | Discharge status: Home / Self Care | Payer: BC Managed Care – PPO | Source: Ambulatory Visit | Attending: Radiation Oncology | Admitting: Radiation Oncology

## 2021-08-28 DIAGNOSIS — W11XXXA Fall on and from ladder, initial encounter: Secondary | ICD-10-CM | POA: Diagnosis not present

## 2021-08-28 DIAGNOSIS — K219 Gastro-esophageal reflux disease without esophagitis: Secondary | ICD-10-CM | POA: Diagnosis not present

## 2021-08-28 DIAGNOSIS — G8911 Acute pain due to trauma: Secondary | ICD-10-CM | POA: Insufficient documentation

## 2021-08-28 DIAGNOSIS — Z8614 Personal history of Methicillin resistant Staphylococcus aureus infection: Secondary | ICD-10-CM | POA: Diagnosis not present

## 2021-08-28 DIAGNOSIS — C61 Malignant neoplasm of prostate: Secondary | ICD-10-CM | POA: Diagnosis present

## 2021-08-28 DIAGNOSIS — I10 Essential (primary) hypertension: Secondary | ICD-10-CM | POA: Diagnosis not present

## 2021-08-28 DIAGNOSIS — Z7982 Long term (current) use of aspirin: Secondary | ICD-10-CM | POA: Diagnosis not present

## 2021-08-28 NOTE — Consult Note (Signed)
NEW PATIENT EVALUATION  Name: Andrew Fuentes  MRN: [id number]  Date:   08/28/2021     DOB: [birth year]   This 65 y.o. 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  REFERRING PHYSICIAN: Wallace, [place]:  Chief Complaint  Patient presents with   Prostate Cancer    Initial consultation    DIAGNOSIS: The encounter diagnosis was Malignant neoplasm of prostate ([place]).   PREVIOUS INVESTIGATIONS:  Pathology report reviewed Clinical notes reviewed MRI and bone scan reviewed  HPI: Patient is a 65 year old male who presented with an elevated PSA in the 6 range.  This prompted evaluation by urology.  He underwent transrectal ultrasound-guided biopsies showing 6 of 12 cores positive for adenocarcinoma.  These were all from the left side of the prostate.  Tumor was mostly Gleason 7 (3+4) although 1 core was 7 (4+3).  His prostate is approximately 35 g.  He has very little side effects has no significant lower urinary tract symptoms.  Bowel function is good.  He does have pain and has a compression fracture of his spine from a traumatic fall from a ladder.  He has been seen by urology and is seen today for radiation oncology opinion.  Patient is adamant about not having surgery.  Patient did have a bone scan which was negative for evidence of metastatic disease.  MRI of the prostate showed signs of capsular abutment and bulging with extracapsular extension and suspicion for neurovascular bundle involvement on the right.  No evidence of pelvic metastatic disease was noted.  PLANNED TREATMENT REGIMEN: I-125 interstitial implant  PAST MEDICAL HISTORY:  has a past medical history of GERD (gastroesophageal reflux disease), Hyperlipidemia, Hypertension, and MRSA (methicillin resistant staph aureus) culture positive (2019).    PAST SURGICAL HISTORY:   Past Surgical History:  Procedure Laterality Date   COLONOSCOPY  2011   [id number], TA x1   LEFT HEART CATH AND CORONARY ANGIOGRAPHY N/A 10/10/2020   Procedure: LEFT HEART CATH AND CORONARY ANGIOGRAPHY;  Surgeon: Jettie Booze, MD;  Location: [facility];  Service: Cardiovascular;  Laterality: N/A;   POLYPECTOMY  2011   TA   PROSTATE BIOPSY  07/21/2021    FAMILY HISTORY: family history is not on file.  SOCIAL HISTORY:  reports that he has never smoked. He has never used smokeless tobacco. He reports current alcohol use. He reports that he does not use drugs.  ALLERGIES: Wound dressing adhesive  MEDICATIONS:  Current Outpatient Medications  Medication Sig Dispense Refill   aspirin EC 81 MG tablet Take 1 tablet (81 mg total) by mouth daily. Swallow whole. 90 tablet 3   Ibuprofen 200 MG CAPS Take 2 capsules by mouth every 6 (six) hours as needed (pain).     losartan (COZAAR) 25 MG tablet Take 1 tablet (25 mg total) by mouth daily. 90 tablet 3   Menaquinone-7 (VITAMIN K2) 40 MCG TABS      metoprolol succinate (TOPROL XL) 25 MG 24 hr tablet Take 1 tablet (25 mg total) by mouth daily. 90 tablet 3   Multiple Vitamins-Minerals (MENS MULTIPLUS PO) Take by mouth.     Omega-3 1000 MG CAPS Take 2 capsules by mouth daily.     oxyCODONE-acetaminophen (PERCOCET/ROXICET) 5-325 MG tablet Take 1 tablet by mouth every 6 (six) hours as needed.     rosuvastatin (CRESTOR) 40 MG  tablet Take 1 tablet (40 mg total) by mouth daily. 90 tablet 1   Turmeric 400 MG CAPS Take 2 tablets by mouth daily.     VITAMIN D, CHOLECALCIFEROL, PO Take by mouth.     Zinc 50 MG TABS Take by mouth.     No current facility-administered medications for this encounter.    ECOG PERFORMANCE STATUS:  0 - Asymptomatic  REVIEW OF SYSTEMS: Patient denies any weight loss, fatigue, weakness, fever, chills or night sweats. Patient denies any loss of vision, blurred vision. Patient denies any ringing  of the ears or  hearing loss. No irregular heartbeat. Patient denies heart murmur or history of fainting. Patient denies any chest pain or pain radiating to her upper extremities. Patient denies any shortness of breath, difficulty breathing at night, cough or hemoptysis. Patient denies any swelling in the lower legs. Patient denies any nausea vomiting, vomiting of blood, or coffee ground material in the vomitus. Patient denies any stomach pain. Patient states has had normal bowel movements no significant constipation or diarrhea. Patient denies any dysuria, hematuria or significant nocturia. Patient denies any problems walking, swelling in the joints or loss of balance. Patient denies any skin changes, loss of hair or loss of weight. Patient denies any excessive worrying or anxiety or significant depression. Patient denies any problems with insomnia. Patient denies excessive thirst, polyuria, polydipsia. Patient denies any swollen glands, patient denies easy bruising or easy bleeding. Patient denies any recent infections, allergies or URI. Patient "s visual fields have not changed significantly in recent time.   PHYSICAL EXAM: BP (P) 121/83 (BP Location: Left Arm, Patient Position: Sitting)   Pulse (P) 74   Temp (P) 97.6 F (36.4 C) (Tympanic)   Resp (P) 16   Wt (P) 235 lb 6.4 oz (106.8 kg)   BMI (P) 33.78 kg/m  Well-developed well-nourished patient in NAD. HEENT reveals PERLA, EOMI, discs not visualized.  Oral cavity is clear. No oral mucosal lesions are identified. Neck is clear without evidence of cervical or supraclavicular adenopathy. Lungs are clear to A&P. Cardiac examination is essentially unremarkable with regular rate and rhythm without murmur rub or thrill. Abdomen is benign with no organomegaly or masses noted. Motor sensory and DTR levels are equal and symmetric in the upper and lower extremities. Cranial nerves II through XII are grossly intact. Proprioception is intact. No peripheral adenopathy or edema is  identified. No motor or sensory levels are noted. Crude visual fields are within normal range.  LABORATORY DATA: Pathology report reviewed    RADIOLOGY RESULTS: MRI scan of the prostate as well as bone scan reviewed compatible with above-stated findings   IMPRESSION: Stage IIb adenocarcinoma the prostate in 65 year old male  PLAN: I have run the [facility] nomogram showing at 56% chance of organ confined disease 43% chance of extracapsular extension only 4% chance of lymph node involvement.  At this time I have recommended I-125 interstitial implant.  Patient is adamant about not having surgical intervention.  Risks and benefits of treatment including increased lower urinary tract symptoms diarrhea fatigue risks of general anesthesia all were reviewed with the patient and his daughter.  I also like the patient to be suppressed with Eligard for the next 6 months and have referred him back to Dr. Bernardo Heater for that.  We will go ahead and start planning his treatments with a volume study prior to his actual implant.  Patient and daughter both comprehend my recommendations well.  I would like to take this opportunity  to thank you for allowing me to participate in the care of your patient.[name], MD

## 2021-08-31 ENCOUNTER — Other Ambulatory Visit: Payer: Self-pay

## 2021-08-31 ENCOUNTER — Telehealth: Payer: Self-pay

## 2021-08-31 DIAGNOSIS — C61 Malignant neoplasm of prostate: Secondary | ICD-10-CM

## 2021-08-31 NOTE — Telephone Encounter (Signed)
Eligard will lower his testosterone levels to <50.  Long-term this can lead to osteoporosis.  As far as any short-term effects of low testosterone on his back recovery would have him relay this information to whoever he is seen for his back fracture

## 2021-08-31 NOTE — Telephone Encounter (Signed)
-----   Message from Daiva Huge, RN sent at 08/28/2021  2:11 PM EST ----- Regarding: Eligard Good afternoon,   Mr. Rayburn will need to receive an Eligard injection.   Thanks,   [facility]

## 2021-08-31 NOTE — Telephone Encounter (Signed)
Pt calls triage line and questions of the Eligard injection will have any bearing on his back recovery, he currently has a compound fracture of the back. Please advise.

## 2021-08-31 NOTE — Telephone Encounter (Signed)
Forms faxed for Eligard benefits investigation.   Called pt informed him of this process, pt voiced understanding.

## 2021-08-31 NOTE — Progress Notes (Signed)
[facility] Posting Form   Surgery Date/Time: Date: 10/10/2021  Surgeon: Dr. John Giovanni  Surgery Location: Day Surgery  Inpt ( No  )   Outpt (Yes)   Obs ( No  )   Diagnosis: C61 Prostate Cancer   -CPT: 33612,24497  Surgery: Radioactive Seed Implant, BrachyTherapy  *Orders entered into EPIC  Date: 08/31/21   *Case booked in EPIC  Date: 08/31/21  *Notified pt of Surgery: Date: 08/31/21  *Placed into Prior Authorization Work Fabio Bering Date: 08/31/21   Assistant/laser/rep:No

## 2021-09-01 ENCOUNTER — Encounter: Payer: Self-pay | Admitting: *Deleted

## 2021-09-13 NOTE — Telephone Encounter (Signed)
Incoming approval of Eligard injection, dates:  09/11/21 - 09/11/22.  Pt informed and scheduled.

## 2021-09-19 ENCOUNTER — Ambulatory Visit: Payer: BC Managed Care – PPO | Admitting: Urology

## 2021-09-19 NOTE — Progress Notes (Signed)
09/20/2021 4:36 PM   Andrew Fuentes [facility] 1956-05-22 [id number]  Referring provider: Nicolette Bang, MD [address] [place],  [place] [address]  Chief Complaint  Patient presents with   Prostate Cancer    Eligard injection   Urological history 1. Prostate cancer -PSA 5.6 in 12/2020 -Intermediate risk (unfavorable) prostate cancer - I-125 interstitial implant planned for 10/10/2021 -6 months of ADT recommended   2. BPH with LU TS -prostate volume 35 cc on prostate ultrasound during prostate biopsy   HPI: Andrew Fuentes is a 65 y.o. male who presents today to start ADT.  He states the representative has contacted him as well and they discussed side effects.  He is taking the recommended calcium and Vitamin D.      PMH: Past Medical History:  Diagnosis Date   Cancer ([place])    GERD (gastroesophageal reflux disease)    Hyperlipidemia    Hypertension    MRSA (methicillin resistant staph aureus) culture positive 2019    Surgical History: Past Surgical History:  Procedure Laterality Date   COLONOSCOPY  2011   [id number], TA x1   LEFT HEART CATH AND CORONARY ANGIOGRAPHY N/A 10/10/2020   Procedure: LEFT HEART CATH AND CORONARY ANGIOGRAPHY;  Surgeon: Jettie Booze, MD;  Location: [facility];  Service: Cardiovascular;  Laterality: N/A;   POLYPECTOMY  2011   TA   PROSTATE BIOPSY  07/21/2021    Home Medications:  Allergies as of 09/20/2021       Reactions   Wound Dressing Adhesive Itching        Medication List        Accurate as of September 20, 2021 11:59 PM. If you have any questions, ask your nurse or doctor.          aspirin EC 81 MG tablet Take 1 tablet (81 mg total) by mouth daily. Swallow whole. What changed: when to take this   CALCIUM 1000 + D PO Take 1 tablet by mouth daily. (Plant Based)   ibuprofen 200 MG tablet Commonly known as: ADVIL Take 200 mg by mouth every 6 (six) hours as needed (pain.).    losartan 25 MG tablet Commonly known as: COZAAR Take 1 tablet (25 mg total) by mouth daily.   metoprolol succinate 25 MG 24 hr tablet Commonly known as: Toprol XL Take 1 tablet (25 mg total) by mouth daily. What changed: when to take this   multivitamin with minerals Tabs tablet Take 1 tablet by mouth every evening. [facility] Multivitamin   PROBIOTIC ADVANCED PO Take 1 capsule by mouth in the morning.   rosuvastatin 40 MG tablet Commonly known as: CRESTOR Take 1 tablet (40 mg total) by mouth daily. What changed: when to take this   TURMERIC PO Take 1 capsule by mouth in the morning.   Zinc 50 MG Tabs Take 50 mg by mouth in the morning.        Allergies:  Allergies  Allergen Reactions   Wound Dressing Adhesive Itching    Family History: Family History  Problem Relation Age of Onset   Colon cancer Neg Hx    Colon polyps Neg Hx    Esophageal cancer Neg Hx    Rectal cancer Neg Hx    Stomach cancer Neg Hx     Social History:  reports that he has never smoked. He has never used smokeless tobacco. He reports current alcohol use. He reports that he does not use drugs.  ROS: Pertinent ROS in HPI  Physical Exam: BP 122/69   Pulse 62   Ht 6' (1.829 m)   Wt 230 lb (104.3 kg)   BMI 31.19 kg/m   Constitutional:  Well nourished. Alert and oriented, No acute distress. HEENT: [place] AT, mask in place.  Trachea midline Cardiovascular: No clubbing, cyanosis, or edema. Respiratory: Normal respiratory effort, no increased work of breathing. Neurologic: Grossly intact, no focal deficits, moving all 4 extremities. Psychiatric: Normal mood and affect.  Laboratory Data: Lab Results  Component Value Date   WBC 6.0 10/03/2020   HGB 14.5 10/03/2020   HCT 41.4 10/03/2020   MCV 95 10/03/2020   PLT 240 10/03/2020    Lab Results  Component Value Date   CREATININE 1.13 07/07/2021       Component Value Date/Time   CHOL 137 07/07/2021 0738   HDL 45 07/07/2021 0738    CHOLHDL 3.0 07/07/2021 0738   CHOLHDL 5 11/22/2009 1539   VLDL 18.4 11/22/2009 1539   LDLCALC 69 07/07/2021 0738    Lab Results  Component Value Date   AST 26 07/07/2021   Lab Results  Component Value Date   ALT 24 07/07/2021  I have reviewed the labs.   Pertinent Imaging: N/A  Assessment & Plan:    1. Prostate cancer -Discussed with patient injection site reactions could include transient burning and stinging, pain, bruising and redness -Discussed with patient that common side effects include hot flashes, sweats, fatigue, weakness, muscle pain, dizziness, clamminess, testicular shrinkage, decreased erections and enlargement of breasts -Discussed the side effect of thinning of the bones that may lead to a fracture and it is important that he start calcium supplementation with vitamin D -Discussed the increased risks of heart attack, irregular heartbeat, sudden death due to heart problems and stroke -Discussed that elevated blood sugar and increased risk developing diabetes may occur  -6 months Eligard given today  Return in about 6 months (around 03/20/2022) for 21mo w/PSA prior.  These notes generated with voice recognition software. I apologize for typographical errors.  Zara Council, PA-C  [facility] [address]  [place],  [phone]   I spent 15 minutes on the day of the encounter to include pre-visit record review, face-to-face time with the patient, and post-visit ordering of tests.

## 2021-09-20 ENCOUNTER — Ambulatory Visit
Admission: RE | Admit: 2021-09-20 | Discharge: 2021-09-20 | Disposition: A | Discharge status: Home / Self Care | Payer: BC Managed Care – PPO | Source: Ambulatory Visit | Attending: Radiation Oncology | Admitting: Radiation Oncology

## 2021-09-20 ENCOUNTER — Encounter: Payer: Self-pay | Admitting: Urology

## 2021-09-20 ENCOUNTER — Other Ambulatory Visit: Payer: Self-pay

## 2021-09-20 ENCOUNTER — Encounter: Payer: Self-pay | Admitting: Radiation Oncology

## 2021-09-20 ENCOUNTER — Ambulatory Visit (INDEPENDENT_AMBULATORY_CARE_PROVIDER_SITE_OTHER): Payer: BC Managed Care – PPO | Admitting: Urology

## 2021-09-20 ENCOUNTER — Ambulatory Visit: Admission: RE | Admit: 2021-09-20 | Payer: BC Managed Care – PPO | Source: Home / Self Care

## 2021-09-20 VITALS — BP 152/95 | HR 58 | Temp 95.9°F | Resp 16 | Wt 236.8 lb

## 2021-09-20 VITALS — BP 122/69 | HR 62 | Ht 72.0 in | Wt 230.0 lb

## 2021-09-20 DIAGNOSIS — E785 Hyperlipidemia, unspecified: Secondary | ICD-10-CM | POA: Diagnosis not present

## 2021-09-20 DIAGNOSIS — K219 Gastro-esophageal reflux disease without esophagitis: Secondary | ICD-10-CM | POA: Insufficient documentation

## 2021-09-20 DIAGNOSIS — Z79899 Other long term (current) drug therapy: Secondary | ICD-10-CM | POA: Diagnosis not present

## 2021-09-20 DIAGNOSIS — C61 Malignant neoplasm of prostate: Secondary | ICD-10-CM

## 2021-09-20 DIAGNOSIS — Z7982 Long term (current) use of aspirin: Secondary | ICD-10-CM | POA: Diagnosis not present

## 2021-09-20 DIAGNOSIS — I10 Essential (primary) hypertension: Secondary | ICD-10-CM | POA: Insufficient documentation

## 2021-09-20 DIAGNOSIS — Z8614 Personal history of Methicillin resistant Staphylococcus aureus infection: Secondary | ICD-10-CM | POA: Insufficient documentation

## 2021-09-20 SURGERY — ULTRASOUND, PROSTATE, FOR VOLUME DETERMINATION
Anesthesia: Choice

## 2021-09-20 MED ORDER — LEUPROLIDE ACETATE (6 MONTH) 45 MG ~~LOC~~ KIT
45.0000 mg | PACK | Freq: Once | SUBCUTANEOUS | Status: AC
Start: 2021-09-20 — End: 2021-09-20
  Administered 2021-09-20: 45 mg via SUBCUTANEOUS

## 2021-09-20 NOTE — Progress Notes (Signed)
Eligard SubQ Injection   Due to Prostate Cancer patient is present today for a Eligard Injection.  Medication: Eligard 6 month Dose: 45 mg  Location: left lower abd Lot: [id number] Exp: 12/2022  Patient tolerated well, no complications were noted  Performed by: Fonnie Jarvis, CMA  Per Zara Council patient is a one time 6 month therapy dose.  Patient advised to start Vitamin D 800-1000iu and Calium 1000-1200mg  daily while on Androgen Deprivation Therapy.  PA approval dates:  Incoming approval of Eligard injection, dates:   09/11/21 - 09/11/22.

## 2021-09-20 NOTE — Progress Notes (Signed)
Radiation Oncology Follow up Note  Name: Andrew Fuentes   Date:   09/20/2021 MRN:  [id number] DOB: 11-20-1955    This 65 y.o. male presents to the OR today for volume study in anticipation of I-125 interstitial implant for stage IIb adenocarcinoma the prostate  REFERRING PROVIDER: Caryl Never*  HPI: Patient is a 65 year old male previously consulted back in early November.  He presented with a PSA elevated in the 6 range.  Biopsy was positive for mostly Gleason 7 (3+4) adenocarcinoma the prostate.  Had a 35 g prostate.  He opted for I-125 interstitial implant.  MRI scan showed signs of capsular abutment and bulging consistent with possible extracapsular extension and involvement of the neurovascular bundle.  No evidence of nodal metastasis were noted.  He opted for an I-125 interstitial implant.  Seen today for volume study anticipation of that procedure.  He continues to do well very low side effect profile.  Specifically denies any increased lower urinary tract symptoms or diarrhea at this time..  COMPLICATIONS OF TREATMENT: none  FOLLOW UP COMPLIANCE: keeps appointments   PHYSICAL EXAM:  There were no vitals taken for this visit. Well-developed well-nourished patient in NAD. HEENT reveals PERLA, EOMI, discs not visualized.  Oral cavity is clear. No oral mucosal lesions are identified. Neck is clear without evidence of cervical or supraclavicular adenopathy. Lungs are clear to A&P. Cardiac examination is essentially unremarkable with regular rate and rhythm without murmur rub or thrill. Abdomen is benign with no organomegaly or masses noted. Motor sensory and DTR levels are equal and symmetric in the upper and lower extremities. Cranial nerves II through XII are grossly intact. Proprioception is intact. No peripheral adenopathy or edema is identified. No motor or sensory levels are noted. Crude visual fields are within normal range.  RADIOLOGY RESULTS: Ultrasound used for volume  study  PLAN: Patient was taken to the cystoscopy suite in the OR. Patient was placed in the low lithotomy position. Foley catheter was placed. Trans-rectal ultrasound probe was inserted into the rectum and prostate seminal vesicles were visualized as well as bladder base. stepping images were performed on a 5 mm increments. Images will be placed in BrachyVision treatment planning system to determine seed placement coordinates for eventual I-125 interstitial implant. Images will be reviewed with the physics and dosimetry staff for final quality approval. I personally was present for the volume study and assisted in delineation of contour volumes.  At the end of the procedure Foley catheter was removed, rectal ultrasound probe was removed. Patient tolerated his procedures extremely well with no side effects or complaints. Patient has given appointment for interstitial implant date. Consent was signed today as well as history and physical performed in preparation for his outpatient surgical implant.     [name], MD

## 2021-09-20 NOTE — H&P (Signed)
History and physical Name: Andrew Fuentes  MRN: [id number]  Date:   09/20/2021     DOB: Apr 01, 1956   This 65 y.o. 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   PREVIOUS INVESTIGATIONS:  MRI and bone scan reviewed Clinical notes reviewed Pathology report reviewed  HPI: Patient is a 65 year old male previously consulted back in early November.  He presented with a PSA elevated in the 6 range.  Biopsy was positive for mostly Gleason 7 (3+4) adenocarcinoma the prostate.  Had a 35 g prostate.  He opted for I-125 interstitial implant.  MRI scan showed signs of capsular abutment and bulging consistent with possible extracapsular extension and involvement of the neurovascular bundle.  No evidence of nodal metastasis were noted.  He opted for an I-125 interstitial implant.  Seen today for volume study anticipation of that procedure.  He continues to do well very low side effect profile.  Specifically denies any increased lower urinary tract symptoms or diarrhea at this time.  PLANNED TREATMENT REGIMEN: I-125 interstitial implant  PAST MEDICAL HISTORY:  has a past medical history of GERD (gastroesophageal reflux disease), Hyperlipidemia, Hypertension, and MRSA (methicillin resistant staph aureus) culture positive (2019).    PAST SURGICAL HISTORY:  Past Surgical History:  Procedure Laterality Date   COLONOSCOPY  2011   [id number], TA x1   LEFT HEART CATH AND CORONARY ANGIOGRAPHY N/A 10/10/2020   Procedure: LEFT HEART CATH AND CORONARY ANGIOGRAPHY;  Surgeon: Jettie Booze, MD;  Location: [facility];  Service: Cardiovascular;  Laterality: N/A;   POLYPECTOMY  2011   TA   PROSTATE BIOPSY  07/21/2021    FAMILY HISTORY: family history is not on file.  SOCIAL HISTORY:  reports that he has never smoked. He has never used smokeless tobacco. He reports current alcohol use. He reports that he does not use drugs.  ALLERGIES: Wound dressing adhesive  MEDICATIONS:  Current Outpatient Medications  Medication Sig Dispense Refill   aspirin EC 81 MG tablet Take 1 tablet (81 mg total) by mouth daily. Swallow whole. (Patient taking differently: Take 81 mg by mouth every evening. Swallow whole.) 90 tablet 3   Calcium Carb-Cholecalciferol (CALCIUM 1000 + D PO) Take 1 tablet by mouth in the morning, at noon, and at bedtime. (Plant Based)     ibuprofen (ADVIL) 200 MG tablet Take 400 mg by mouth every 6 (six) hours as needed (pain.).     losartan (COZAAR) 25 MG tablet Take 1 tablet (25 mg total) by mouth daily. 90 tablet 3   metoprolol succinate (TOPROL XL) 25 MG 24 hr tablet Take 1 tablet (25 mg total) by mouth daily. (Patient taking differently: Take 25 mg by mouth every evening.) 90 tablet 3   Multiple Vitamin (MULTIVITAMIN WITH MINERALS) TABS tablet Take 1 tablet by mouth every evening. Centum Multivitamin     Probiotic Product (PROBIOTIC ADVANCED PO) Take 1 capsule by mouth in the morning.     rosuvastatin (CRESTOR) 40 MG tablet Take 1 tablet (40 mg total) by mouth daily. (Patient taking differently: Take 40 mg by mouth every evening.) 90 tablet 1  TURMERIC PO Take 1 capsule by mouth in the morning.     Zinc 50 MG TABS Take 50 mg by mouth in the morning.     No current facility-administered medications for this encounter.    ECOG PERFORMANCE STATUS:  0 - Asymptomatic  REVIEW OF SYSTEMS: Patient denies any weight loss, fatigue, weakness, fever, chills or night sweats. Patient denies any loss of vision, blurred vision. Patient denies any ringing  of the ears or hearing loss. No irregular  heartbeat. Patient denies heart murmur or history of fainting. Patient denies any chest pain or pain radiating to her upper extremities. Patient denies any shortness of breath, difficulty breathing at night, cough or hemoptysis. Patient denies any swelling in the lower legs. Patient denies any nausea vomiting, vomiting of blood, or coffee ground material in the vomitus. Patient denies any stomach pain. Patient states has had normal bowel movements no significant constipation or diarrhea. Patient denies any dysuria, hematuria or significant nocturia. Patient denies any problems walking, swelling in the joints or loss of balance. Patient denies any skin changes, loss of hair or loss of weight. Patient denies any excessive worrying or anxiety or significant depression. Patient denies any problems with insomnia. Patient denies excessive thirst, polyuria, polydipsia. Patient denies any swollen glands, patient denies easy bruising or easy bleeding. Patient denies any recent infections, allergies or URI. Patient "s visual fields have not changed significantly in recent time.   PHYSICAL EXAM: BP (!) 152/95 (BP Location: Left Arm, Patient Position: Sitting)   Pulse (!) 58   Temp (!) 95.9 F (35.5 C) (Tympanic)   Resp 16   Wt 236 lb 12.8 oz (107.4 kg)   BMI 33.98 kg/m  Well-developed well-nourished patient in NAD. HEENT reveals PERLA, EOMI, discs not visualized.  Oral cavity is clear. No oral mucosal lesions are identified. Neck is clear without evidence of cervical or supraclavicular adenopathy. Lungs are clear to A&P. Cardiac examination is essentially unremarkable with regular rate and rhythm without murmur rub or thrill. Abdomen is benign with no organomegaly or masses noted. Motor sensory and DTR levels are equal and symmetric in the upper and lower extremities. Cranial nerves II through XII are grossly intact. Proprioception is intact. No peripheral adenopathy or edema is identified. No motor or sensory  levels are noted. Crude visual fields are within normal range.  LABORATORY DATA: Pathology report reviewed    RADIOLOGY RESULTS: MRI scan reviewed volume study ultrasound studies reviewed   IMPRESSION: Stage IIb adenocarcinoma the prostate in 65 year old male  PLAN: This time patient is cleared for I-125 interstitial implant.  Volume study was performed successfully.  We will make some attempt to wait more seeds in the area of the capsular bulging on the right.  Risks and benefits of treatment occluding increased lower Neri tract symptoms diarrhea fatigue alteration of blood counts and the risks of general anesthesia were reviewed with the patient.  I also reviewed radiation safety precautions and that he will be radioactive for 2 months.  Patient comprehends my recommendations well.  I would like to take this opportunity to thank you for allowing me to participate in the care of your patient.[name], MD

## 2021-09-20 NOTE — Patient Instructions (Signed)
Start taking Vitamin D 800-1000iu and Calium 1000-1200mg  daily while on Androgen Deprivation Therapy.   Leuprolide depot injection What is this medication? LEUPROLIDE (loo PROE lide) is a man-made protein that acts like a natural hormone in the body. It decreases testosterone in men and decreases estrogen in women. In men, this medicine is used to treat advanced prostate cancer. In women, some forms of this medicine may be used to treat endometriosis, uterine fibroids, or other male hormone-related problems. This medicine may be used for other purposes; ask your health care provider or pharmacist if you have questions. COMMON BRAND NAME(S): Eligard, Fensolv, Lupron Depot, Lupron Depot-Ped, Viadur What should I tell my care team before I take this medication? They need to know if you have any of these conditions: diabetes heart disease or previous heart attack high blood pressure high cholesterol mental illness osteoporosis pain or difficulty passing urine seizures spinal cord metastasis stroke suicidal thoughts, plans, or attempt; a previous suicide attempt by you or a family member tobacco smoker unusual vaginal bleeding (women) an unusual or allergic reaction to leuprolide, benzyl alcohol, other medicines, foods, dyes, or preservatives pregnant or trying to get pregnant breast-feeding How should I use this medication? This medicine is for injection into a muscle or for injection under the skin. It is given by a health care professional in a hospital or clinic setting. The specific product will determine how it will be given to you. Make sure you understand which product you receive and how often you will receive it. Talk to your pediatrician regarding the use of this medicine in children. Special care may be needed. Overdosage: If you think you have taken too much of this medicine contact a poison control center or emergency room at once. NOTE: This medicine is only for you. Do not  share this medicine with others. What if I miss a dose? It is important not to miss a dose. Call your doctor or health care professional if you are unable to keep an appointment. Depot injections: Depot injections are given either once-monthly, every 12 weeks, every 16 weeks, or every 24 weeks depending on the product you are prescribed. The product you are prescribed will be based on if you are male or male, and your condition. Make sure you understand your product and dosing. What may interact with this medication? Do not take this medicine with any of the following medications: chasteberry cisapride dronedarone pimozide thioridazine This medicine may also interact with the following medications: herbal or dietary supplements, like black cohosh or DHEA male hormones, like estrogens or progestins and birth control pills, patches, rings, or injections male hormones, like testosterone other medicines that prolong the QT interval (abnormal heart rhythm) This list may not describe all possible interactions. Give your health care provider a list of all the medicines, herbs, non-prescription drugs, or dietary supplements you use. Also tell them if you smoke, drink alcohol, or use illegal drugs. Some items may interact with your medicine. What should I watch for while using this medication? Visit your doctor or health care professional for regular checks on your progress. During the first weeks of treatment, your symptoms may get worse, but then will improve as you continue your treatment. You may get hot flashes, increased bone pain, increased difficulty passing urine, or an aggravation of nerve symptoms. Discuss these effects with your doctor or health care professional, some of them may improve with continued use of this medicine. Male patients may experience a menstrual cycle or spotting  during the first months of therapy with this medicine. If this continues, contact your doctor or health  care professional. This medicine may increase blood sugar. Ask your healthcare provider if changes in diet or medicines are needed if you have diabetes. What side effects may I notice from receiving this medication? Side effects that you should report to your doctor or health care professional as soon as possible: allergic reactions like skin rash, itching or hives, swelling of the face, lips, or tongue breathing problems chest pain depression or memory disorders pain in your legs or groin pain at site where injected or implanted seizures severe headache signs and symptoms of high blood sugar such as being more thirsty or hungry or having to urinate more than normal. You may also feel very tired or have blurry vision swelling of the feet and legs suicidal thoughts or other mood changes visual changes vomiting Side effects that usually do not require medical attention (report to your doctor or health care professional if they continue or are bothersome): breast swelling or tenderness decrease in sex drive or performance diarrhea hot flashes loss of appetite muscle, joint, or bone pains nausea redness or irritation at site where injected or implanted skin problems or acne This list may not describe all possible side effects. Call your doctor for medical advice about side effects. You may report side effects to FDA at 1-800-FDA-1088. Where should I keep my medication? This drug is given in a hospital or clinic and will not be stored at home. NOTE: This sheet is a summary. It may not cover all possible information. If you have questions about this medicine, talk to your doctor, pharmacist, or health care provider.  2022 Elsevier/Gold Standard (2021-06-27 00:00:00)

## 2021-09-20 NOTE — H&P (View-Only) (Signed)
History and physical Name: Andrew Fuentes  MRN: [id number]  Date:   09/20/2021     DOB: 02/08/1956   This 65 y.o. 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   PREVIOUS INVESTIGATIONS:  MRI and bone scan reviewed Clinical notes reviewed Pathology report reviewed  HPI: Patient is a 66 year old male previously consulted back in early November.  He presented with a PSA elevated in the 6 range.  Biopsy was positive for mostly Gleason 7 (3+4) adenocarcinoma the prostate.  Had a 35 g prostate.  He opted for I-125 interstitial implant.  MRI scan showed signs of capsular abutment and bulging consistent with possible extracapsular extension and involvement of the neurovascular bundle.  No evidence of nodal metastasis were noted.  He opted for an I-125 interstitial implant.  Seen today for volume study anticipation of that procedure.  He continues to do well very low side effect profile.  Specifically denies any increased lower urinary tract symptoms or diarrhea at this time.  PLANNED TREATMENT REGIMEN: I-125 interstitial implant  PAST MEDICAL HISTORY:  has a past medical history of GERD (gastroesophageal reflux disease), Hyperlipidemia, Hypertension, and MRSA (methicillin resistant staph aureus) culture positive (2019).    PAST SURGICAL HISTORY:  Past Surgical History:  Procedure Laterality Date   COLONOSCOPY  2011   [id number], TA x1   LEFT HEART CATH AND CORONARY ANGIOGRAPHY N/A 10/10/2020   Procedure: LEFT HEART CATH AND CORONARY ANGIOGRAPHY;  Surgeon: Jettie Booze, MD;  Location: [facility];  Service: Cardiovascular;  Laterality: N/A;   POLYPECTOMY  2011   TA   PROSTATE BIOPSY  07/21/2021    FAMILY HISTORY: family history is not on file.  SOCIAL HISTORY:  reports that he has never smoked. He has never used smokeless tobacco. He reports current alcohol use. He reports that he does not use drugs.  ALLERGIES: Wound dressing adhesive  MEDICATIONS:  Current Outpatient Medications  Medication Sig Dispense Refill   aspirin EC 81 MG tablet Take 1 tablet (81 mg total) by mouth daily. Swallow whole. (Patient taking differently: Take 81 mg by mouth every evening. Swallow whole.) 90 tablet 3   Calcium Carb-Cholecalciferol (CALCIUM 1000 + D PO) Take 1 tablet by mouth in the morning, at noon, and at bedtime. (Plant Based)     ibuprofen (ADVIL) 200 MG tablet Take 400 mg by mouth every 6 (six) hours as needed (pain.).     losartan (COZAAR) 25 MG tablet Take 1 tablet (25 mg total) by mouth daily. 90 tablet 3   metoprolol succinate (TOPROL XL) 25 MG 24 hr tablet Take 1 tablet (25 mg total) by mouth daily. (Patient taking differently: Take 25 mg by mouth every evening.) 90 tablet 3   Multiple Vitamin (MULTIVITAMIN WITH MINERALS) TABS tablet Take 1 tablet by mouth every evening. Centum Multivitamin     Probiotic Product (PROBIOTIC ADVANCED PO) Take 1 capsule by mouth in the morning.     rosuvastatin (CRESTOR) 40 MG tablet Take 1 tablet (40 mg total) by mouth daily. (Patient taking differently: Take 40 mg by mouth every evening.) 90 tablet 1  TURMERIC PO Take 1 capsule by mouth in the morning.     Zinc 50 MG TABS Take 50 mg by mouth in the morning.     No current facility-administered medications for this encounter.    ECOG PERFORMANCE STATUS:  0 - Asymptomatic  REVIEW OF SYSTEMS: Patient denies any weight loss, fatigue, weakness, fever, chills or night sweats. Patient denies any loss of vision, blurred vision. Patient denies any ringing  of the ears or hearing loss. No irregular  heartbeat. Patient denies heart murmur or history of fainting. Patient denies any chest pain or pain radiating to her upper extremities. Patient denies any shortness of breath, difficulty breathing at night, cough or hemoptysis. Patient denies any swelling in the lower legs. Patient denies any nausea vomiting, vomiting of blood, or coffee ground material in the vomitus. Patient denies any stomach pain. Patient states has had normal bowel movements no significant constipation or diarrhea. Patient denies any dysuria, hematuria or significant nocturia. Patient denies any problems walking, swelling in the joints or loss of balance. Patient denies any skin changes, loss of hair or loss of weight. Patient denies any excessive worrying or anxiety or significant depression. Patient denies any problems with insomnia. Patient denies excessive thirst, polyuria, polydipsia. Patient denies any swollen glands, patient denies easy bruising or easy bleeding. Patient denies any recent infections, allergies or URI. Patient "s visual fields have not changed significantly in recent time.   PHYSICAL EXAM: BP (!) 152/95 (BP Location: Left Arm, Patient Position: Sitting)    Pulse (!) 58    Temp (!) 95.9 F (35.5 C) (Tympanic)    Resp 16    Wt 236 lb 12.8 oz (107.4 kg)    BMI 33.98 kg/m  Well-developed well-nourished patient in NAD. HEENT reveals PERLA, EOMI, discs not visualized.  Oral cavity is clear. No oral mucosal lesions are identified. Neck is clear without evidence of cervical or supraclavicular adenopathy. Lungs are clear to A&P. Cardiac examination is essentially unremarkable with regular rate and rhythm without murmur rub or thrill. Abdomen is benign with no organomegaly or masses noted. Motor sensory and DTR levels are equal and symmetric in the upper and lower extremities. Cranial nerves II through XII are grossly intact. Proprioception is intact. No peripheral adenopathy or edema is identified. No motor or sensory  levels are noted. Crude visual fields are within normal range.  LABORATORY DATA: Pathology report reviewed    RADIOLOGY RESULTS: MRI scan reviewed volume study ultrasound studies reviewed   IMPRESSION: Stage IIb adenocarcinoma the prostate in 65 year old male  PLAN: This time patient is cleared for I-125 interstitial implant.  Volume study was performed successfully.  We will make some attempt to wait more seeds in the area of the capsular bulging on the right.  Risks and benefits of treatment occluding increased lower Neri tract symptoms diarrhea fatigue alteration of blood counts and the risks of general anesthesia were reviewed with the patient.  I also reviewed radiation safety precautions and that he will be radioactive for 2 months.  Patient comprehends my recommendations well.  I would like to take this opportunity to thank you for allowing me to participate in the care of your patient.[name], MD

## 2021-09-21 ENCOUNTER — Ambulatory Visit
Admission: RE | Admit: 2021-09-21 | Discharge: 2021-09-21 | Disposition: A | Discharge status: Home / Self Care | Payer: BC Managed Care – PPO | Source: Ambulatory Visit | Attending: Radiation Oncology | Admitting: Radiation Oncology

## 2021-09-21 DIAGNOSIS — Z510 Encounter for antineoplastic radiation therapy: Secondary | ICD-10-CM | POA: Diagnosis not present

## 2021-09-21 DIAGNOSIS — C61 Malignant neoplasm of prostate: Secondary | ICD-10-CM | POA: Insufficient documentation

## 2021-09-29 ENCOUNTER — Other Ambulatory Visit: Payer: Self-pay

## 2021-09-29 ENCOUNTER — Encounter
Admission: RE | Admit: 2021-09-29 | Discharge: 2021-09-29 | Disposition: A | Discharge status: Home / Self Care | Payer: BC Managed Care – PPO | Source: Ambulatory Visit | Attending: Urology | Admitting: Urology

## 2021-09-29 VITALS — Ht 73.0 in | Wt 230.0 lb

## 2021-09-29 DIAGNOSIS — I10 Essential (primary) hypertension: Secondary | ICD-10-CM

## 2021-09-29 DIAGNOSIS — I2089 Other forms of angina pectoris: Secondary | ICD-10-CM

## 2021-09-29 DIAGNOSIS — I208 Other forms of angina pectoris: Secondary | ICD-10-CM

## 2021-09-29 HISTORY — DX: Malignant (primary) neoplasm, unspecified: C80.1

## 2021-09-29 NOTE — Patient Instructions (Addendum)
Your procedure is scheduled on: 10/10/2021  Report to the Registration Desk on the 1st floor of the [facility]. To find out your arrival time, please call [phone] between 1PM - 3PM on: 10/09/2021  REMEMBER: Instructions that are not followed completely may result in serious medical risk, up to and including death; or upon the discretion of your surgeon and anesthesiologist your surgery may need to be rescheduled.  Do not eat food after midnight the night before surgery.  No gum chewing, lozengers or hard candies.  You may however, drink CLEAR liquids up to 2 hours before you are scheduled to arrive for your surgery. Do not drink anything within 2 hours of your scheduled arrival time.  Clear liquids include: - water  - apple juice without pulp - gatorade (not RED, PURPLE, OR BLUE) - black coffee or tea (Do NOT add milk or creamers to the coffee or tea) Do NOT drink anything that is not on this list.   TAKE THESE MEDICATIONS THE MORNING OF SURGERY WITH A SIP OF WATER: Metoprolol rosuvastatin     Follow recommendations from Cardiologist, Pulmonologist or PCP regarding stopping Aspirin  One week prior to surgery: Stop Anti-inflammatories (NSAIDS) such as Advil, Aleve, Ibuprofen, Motrin, Naproxen, Naprosyn and Aspirin based products such as Excedrin, Goodys Powder, BC Powder. Stop ANY OVER THE COUNTER supplements until after surgery like; multivitamin,probiotic, turmeric and zinc You may however, continue to take Tylenol if needed for pain up until the day of surgery.  No Alcohol for 24 hours before or after surgery.  No Smoking including e-cigarettes for 24 hours prior to surgery.  No chewable tobacco products for at least 6 hours prior to surgery.  No nicotine patches on the day of surgery.  Do not use any "recreational" drugs for at least a week prior to your surgery.  Please be advised that the combination of cocaine and anesthesia may have negative outcomes, up to  and including death. If you test positive for cocaine, your surgery will be cancelled.  On the morning of surgery brush your teeth with toothpaste and water, you may rinse your mouth with mouthwash if you wish. Do not swallow any toothpaste or mouthwash.  Use CHG Soap or wipes as directed on instruction sheet.  Do not wear jewelry, make-up, hairpins, clips or nail polish.  Do not wear lotions, powders, or perfumes.   Do not shave body from the neck down 48 hours prior to surgery just in case you cut yourself which could leave a site for infection.  Also, freshly shaved skin may become irritated if using the CHG soap.  Contact lenses, hearing aids and dentures may not be worn into surgery.  Do not bring valuables to the hospital. [facility] is not responsible for any missing/lost belongings or valuables.    Fleets enema or bowel prep as directed at home. This is very important. Pick up sodium phosphate fleet enema at any pharmacy and administer it morning of procedure. Please make sure this is done at home.  (Pre-op RN will ask you this)   Notify your doctor if there is any change in your medical condition (cold, fever, infection).  Wear comfortable clothing (specific to your surgery type) to the hospital.  After surgery, you can help prevent lung complications by doing breathing exercises.  Take deep breaths and cough every 1-2 hours. Your doctor may order a device called an Incentive Spirometer to help you take deep breaths.  If you are being admitted to  the hospital overnight, leave your suitcase in the car. After surgery it may be brought to your room.  If you are being discharged the day of surgery, you will not be allowed to drive home. You will need a responsible adult (18 years or older) to drive you home and stay with you that night.   If you are taking public transportation, you will need to have a responsible adult (18 years or older) with you. Please confirm with your  physician that it is acceptable to use public transportation.   Please call the [facility] at [phone] if you have any questions about these instructions.  Surgery Visitation Policy:  Patients undergoing a surgery or procedure may have one family member or support person with them as long as that person is not COVID-19 positive or experiencing its symptoms.  That person may remain in the waiting area during the procedure and may rotate out with other people.

## 2021-10-03 ENCOUNTER — Encounter
Admission: RE | Admit: 2021-10-03 | Discharge: 2021-10-03 | Disposition: A | Discharge status: Home / Self Care | Payer: BC Managed Care – PPO | Source: Ambulatory Visit | Attending: Urology | Admitting: Urology

## 2021-10-03 ENCOUNTER — Encounter: Payer: Self-pay | Admitting: Urgent Care

## 2021-10-03 ENCOUNTER — Other Ambulatory Visit: Payer: Self-pay

## 2021-10-03 DIAGNOSIS — I208 Other forms of angina pectoris: Secondary | ICD-10-CM | POA: Diagnosis not present

## 2021-10-03 DIAGNOSIS — Z01812 Encounter for preprocedural laboratory examination: Secondary | ICD-10-CM | POA: Insufficient documentation

## 2021-10-03 DIAGNOSIS — I10 Essential (primary) hypertension: Secondary | ICD-10-CM | POA: Diagnosis not present

## 2021-10-03 LAB — CBC
HCT: 37.2 % — ABNORMAL LOW (ref 39.0–52.0)
Hemoglobin: 13.1 g/dL (ref 13.0–17.0)
MCH: 33.7 pg (ref 26.0–34.0)
MCHC: 35.2 g/dL (ref 30.0–36.0)
MCV: 95.6 fL (ref 80.0–100.0)
Platelets: 200 10*3/uL (ref 150–400)
RBC: 3.89 MIL/uL — ABNORMAL LOW (ref 4.22–5.81)
RDW: 12.3 % (ref 11.5–15.5)
WBC: 4.6 10*3/uL (ref 4.0–10.5)
nRBC: 0 % (ref 0.0–0.2)

## 2021-10-03 LAB — BASIC METABOLIC PANEL
Anion gap: 4 — ABNORMAL LOW (ref 5–15)
BUN: 23 mg/dL (ref 8–23)
CO2: 26 mmol/L (ref 22–32)
Calcium: 8.7 mg/dL — ABNORMAL LOW (ref 8.9–10.3)
Chloride: 106 mmol/L (ref 98–111)
Creatinine, Ser: 0.88 mg/dL (ref 0.61–1.24)
GFR, Estimated: >60 mL/min (ref >60)
Glucose, Bld: 98 mg/dL (ref 70–99)
Potassium: 4 mmol/L (ref 3.5–5.1)
Sodium: 136 mmol/L (ref 135–145)

## 2021-10-03 NOTE — Progress Notes (Addendum)
°  Perioperative Services Pre-Admission/Anesthesia Testing   Date: 10/03/21 Name: Andrew Fuentes MRN:   [id number]  Re: Consideration of preoperative prophylactic antibiotic change   Request sent to: Abbie Sons, MD (routed and/or faxed via [facility])  Planned Surgical Procedure(s):    Case: [id number] Date/Time: 10/10/21 0715   Procedure: RADIOACTIVE SEED IMPLANT/BRACHYTHERAPY IMPLANT   Anesthesia type: General   Pre-op diagnosis: Prostate Cancer   Location: [facility] OR ROOM 10 / [facility] FOR ANESTHESIA GROUP   Surgeons: Abbie Sons, MD   Clinical Notes:  Patient has no documented allergy to PCN   Request:  As an evidence based approach to reducing the rate of incidence for post-operative SSI and the development of MDROs, could an agent with narrower coverage for preoperative prophylaxis in this patient's upcoming surgical course be considered?   Currently ordered preoperative prophylactic ABX: ciprofloxacin.   Specifically requesting change to cephalosporin (CEFAZOLIN).   Please communicate decision with me and I will change the orders in Epic as per your direction.     Citation: Beckie Salts, Wardell Heath et al: Best practice statement on urologic procedures and antimicrobial prophylaxis. J Urol 2020; 203: 351.   Honor Loh, MSN, APRN, FNP-C, CEN [facility]  Peri-operative Services Nurse Practitioner FAX: [phone] 10/03/21 11:50 AM

## 2021-10-04 DIAGNOSIS — C61 Malignant neoplasm of prostate: Secondary | ICD-10-CM | POA: Insufficient documentation

## 2021-10-04 DIAGNOSIS — Z510 Encounter for antineoplastic radiation therapy: Secondary | ICD-10-CM | POA: Diagnosis not present

## 2021-10-10 ENCOUNTER — Ambulatory Visit
Admission: RE | Admit: 2021-10-10 | Discharge: 2021-10-10 | Disposition: A | Discharge status: Home / Self Care | Payer: BC Managed Care – PPO | Attending: Urology | Admitting: Urology

## 2021-10-10 ENCOUNTER — Other Ambulatory Visit: Payer: Self-pay

## 2021-10-10 ENCOUNTER — Ambulatory Visit: Payer: BC Managed Care – PPO | Admitting: Urgent Care

## 2021-10-10 ENCOUNTER — Ambulatory Visit: Payer: BC Managed Care – PPO

## 2021-10-10 ENCOUNTER — Ambulatory Visit: Payer: BC Managed Care – PPO | Attending: Radiation Oncology

## 2021-10-10 ENCOUNTER — Encounter: Payer: Self-pay | Admitting: Urology

## 2021-10-10 ENCOUNTER — Encounter
Admission: RE | Disposition: A | Discharge status: Home / Self Care | Payer: Self-pay | Source: Home / Self Care | Attending: Urology

## 2021-10-10 DIAGNOSIS — C61 Malignant neoplasm of prostate: Secondary | ICD-10-CM | POA: Diagnosis present

## 2021-10-10 HISTORY — PX: RADIOACTIVE SEED IMPLANT: SHX5150

## 2021-10-10 SURGERY — INSERTION, RADIATION SOURCE, PROSTATE
Anesthesia: General

## 2021-10-10 MED ORDER — LACTATED RINGERS IV SOLN: INTRAVENOUS | Status: DC

## 2021-10-10 MED ORDER — DEXAMETHASONE SODIUM PHOSPHATE 10 MG/ML IJ SOLN
INTRAMUSCULAR | Status: AC
  Filled 2021-10-10: qty 1

## 2021-10-10 MED ORDER — MEPERIDINE HCL 25 MG/ML IJ SOLN: 6.2500 mg | INTRAMUSCULAR | Status: DC | PRN

## 2021-10-10 MED ORDER — BACITRACIN ZINC 500 UNIT/GM EX OINT
TOPICAL_OINTMENT | CUTANEOUS | Status: AC
  Filled 2021-10-10: qty 28.35

## 2021-10-10 MED ORDER — PROPOFOL 10 MG/ML IV BOLUS
INTRAVENOUS | Status: AC
  Filled 2021-10-10: qty 20

## 2021-10-10 MED ORDER — SODIUM CHLORIDE 0.9 % IR SOLN
Status: DC | PRN
  Administered 2021-10-10: 1000 mL

## 2021-10-10 MED ORDER — KETOROLAC TROMETHAMINE 30 MG/ML IJ SOLN
INTRAMUSCULAR | Status: AC
  Filled 2021-10-10: qty 1

## 2021-10-10 MED ORDER — MIDAZOLAM HCL 2 MG/2ML IJ SOLN
INTRAMUSCULAR | Status: AC
  Filled 2021-10-10: qty 2

## 2021-10-10 MED ORDER — EPHEDRINE 5 MG/ML INJ
INTRAVENOUS | Status: AC
  Filled 2021-10-10: qty 5

## 2021-10-10 MED ORDER — PROPOFOL 10 MG/ML IV BOLUS
INTRAVENOUS | Status: AC
  Filled 2021-10-10: qty 40

## 2021-10-10 MED ORDER — FAMOTIDINE 20 MG PO TABS: 20.0000 mg | ORAL_TABLET | Freq: Once | ORAL | Status: AC

## 2021-10-10 MED ORDER — PHENYLEPHRINE HCL (PRESSORS) 10 MG/ML IV SOLN
INTRAVENOUS | Status: AC
  Filled 2021-10-10: qty 1

## 2021-10-10 MED ORDER — ORAL CARE MOUTH RINSE: 15.0000 mL | Freq: Once | OROMUCOSAL | Status: AC

## 2021-10-10 MED ORDER — SEVOFLURANE IN SOLN
RESPIRATORY_TRACT | Status: AC
  Filled 2021-10-10: qty 250

## 2021-10-10 MED ORDER — CHLORHEXIDINE GLUCONATE 0.12 % MT SOLN: 15.0000 mL | Freq: Once | OROMUCOSAL | Status: AC

## 2021-10-10 MED ORDER — ONDANSETRON HCL 4 MG/2ML IJ SOLN: 4.0000 mg | Freq: Once | INTRAMUSCULAR | Status: DC | PRN

## 2021-10-10 MED ORDER — TAMSULOSIN HCL 0.4 MG PO CAPS: 0.4000 mg | ORAL_CAPSULE | Freq: Every day | ORAL | 0 refills | Status: DC

## 2021-10-10 MED ORDER — CIPROFLOXACIN IN D5W 400 MG/200ML IV SOLN
400.0000 mg | INTRAVENOUS | Status: AC
  Administered 2021-10-10: 08:00:00 400 mg via INTRAVENOUS

## 2021-10-10 MED ORDER — BACITRACIN 500 UNIT/GM EX OINT
TOPICAL_OINTMENT | CUTANEOUS | Status: DC | PRN
  Administered 2021-10-10: 1 via TOPICAL

## 2021-10-10 MED ORDER — FENTANYL CITRATE (PF) 100 MCG/2ML IJ SOLN
INTRAMUSCULAR | Status: AC
  Filled 2021-10-10: qty 2

## 2021-10-10 MED ORDER — EPHEDRINE SULFATE 50 MG/ML IJ SOLN
INTRAMUSCULAR | Status: DC | PRN
Start: 2021-10-10 — End: 2021-10-10
  Administered 2021-10-10: 10 mg via INTRAVENOUS
  Administered 2021-10-10 (×2): 5 mg via INTRAVENOUS

## 2021-10-10 MED ORDER — SULFAMETHOXAZOLE-TRIMETHOPRIM 800-160 MG PO TABS: 1.0000 | ORAL_TABLET | Freq: Two times a day (BID) | ORAL | 0 refills | Status: AC

## 2021-10-10 MED ORDER — ONDANSETRON HCL 4 MG/2ML IJ SOLN
INTRAMUSCULAR | Status: DC | PRN
  Administered 2021-10-10: 4 mg via INTRAVENOUS

## 2021-10-10 MED ORDER — FAMOTIDINE 20 MG PO TABS
ORAL_TABLET | ORAL | Status: AC
  Administered 2021-10-10: 07:00:00 20 mg via ORAL
  Filled 2021-10-10: qty 1

## 2021-10-10 MED ORDER — HYDROCODONE-ACETAMINOPHEN 5-325 MG PO TABS: 1.0000 | ORAL_TABLET | ORAL | 0 refills | Status: DC | PRN

## 2021-10-10 MED ORDER — ONDANSETRON HCL 4 MG/2ML IJ SOLN
INTRAMUSCULAR | Status: AC
  Filled 2021-10-10: qty 2

## 2021-10-10 MED ORDER — PROPOFOL 10 MG/ML IV BOLUS
INTRAVENOUS | Status: DC | PRN
  Administered 2021-10-10: 150 mg via INTRAVENOUS

## 2021-10-10 MED ORDER — KETOROLAC TROMETHAMINE 30 MG/ML IJ SOLN
INTRAMUSCULAR | Status: DC | PRN
  Administered 2021-10-10: 15 mg via INTRAVENOUS

## 2021-10-10 MED ORDER — MIDAZOLAM HCL 2 MG/2ML IJ SOLN
INTRAMUSCULAR | Status: DC | PRN
  Administered 2021-10-10: 2 mg via INTRAVENOUS

## 2021-10-10 MED ORDER — LIDOCAINE HCL (CARDIAC) PF 100 MG/5ML IV SOSY
PREFILLED_SYRINGE | INTRAVENOUS | Status: DC | PRN
  Administered 2021-10-10: 80 mg via INTRAVENOUS

## 2021-10-10 MED ORDER — FENTANYL CITRATE (PF) 100 MCG/2ML IJ SOLN: 25.0000 ug | INTRAMUSCULAR | Status: DC | PRN

## 2021-10-10 MED ORDER — FLEET ENEMA 7-19 GM/118ML RE ENEM: 1.0000 | ENEMA | Freq: Once | RECTAL | Status: DC

## 2021-10-10 MED ORDER — DEXAMETHASONE SODIUM PHOSPHATE 10 MG/ML IJ SOLN
INTRAMUSCULAR | Status: DC | PRN
  Administered 2021-10-10: 10 mg via INTRAVENOUS

## 2021-10-10 MED ORDER — CHLORHEXIDINE GLUCONATE 0.12 % MT SOLN
OROMUCOSAL | Status: AC
  Administered 2021-10-10: 07:00:00 15 mL via OROMUCOSAL
  Filled 2021-10-10: qty 15

## 2021-10-10 MED ORDER — FENTANYL CITRATE (PF) 100 MCG/2ML IJ SOLN
INTRAMUSCULAR | Status: DC | PRN
  Administered 2021-10-10: 25 ug via INTRAVENOUS
  Administered 2021-10-10: 50 ug via INTRAVENOUS

## 2021-10-10 MED ORDER — CIPROFLOXACIN IN D5W 400 MG/200ML IV SOLN
INTRAVENOUS | Status: AC
  Filled 2021-10-10: qty 200

## 2021-10-10 SURGICAL SUPPLY — 27 items
BAG DRN RND TRDRP ANRFLXCHMBR (UROLOGICAL SUPPLIES) ×1
BAG URINE DRAIN 2000ML AR STRL (UROLOGICAL SUPPLIES) ×3 IMPLANT
CATH COUDE FOLEY 2W 5CC 16FR (CATHETERS) ×2 IMPLANT
CATH FOL 2WAY LX 16X5 (CATHETERS) ×3 IMPLANT
COVER BACK TABLE REUSABLE LG (DRAPES) ×3 IMPLANT
DRAPE INCISE 23X17 IOBAN STRL (DRAPES) ×2
DRAPE INCISE 23X17 STRL (DRAPES) ×1 IMPLANT
DRAPE INCISE IOBAN 23X17 STRL (DRAPES) ×1 IMPLANT
DRAPE UNDER BUTTOCK W/FLU (DRAPES) ×3 IMPLANT
DRSG TELFA 3X8 NADH (GAUZE/BANDAGES/DRESSINGS) ×3 IMPLANT
GAUZE 4X4 16PLY ~~LOC~~+RFID DBL (SPONGE) ×6 IMPLANT
GLOVE SURG ENC MOIS LTX SZ7.5 (GLOVE) ×6 IMPLANT
GLOVE SURG UNDER POLY LF SZ7.5 (GLOVE) ×3 IMPLANT
GOWN STRL REUS W/ TWL LRG LVL3 (GOWN DISPOSABLE) ×2 IMPLANT
GOWN STRL REUS W/ TWL XL LVL3 (GOWN DISPOSABLE) ×2 IMPLANT
GOWN STRL REUS W/TWL LRG LVL3 (GOWN DISPOSABLE) ×6
GOWN STRL REUS W/TWL XL LVL3 (GOWN DISPOSABLE) ×6
KIT TURNOVER CYSTO (KITS) ×3 IMPLANT
MANIFOLD NEPTUNE II (INSTRUMENTS) ×3 IMPLANT
PACK CYSTO AR (MISCELLANEOUS) ×3 IMPLANT
PAD DRESSING TELFA 3X8 NADH (GAUZE/BANDAGES/DRESSINGS) ×1 IMPLANT
SET CYSTO W/LG BORE CLAMP LF (SET/KITS/TRAYS/PACK) ×3 IMPLANT
SURGILUBE 2OZ TUBE FLIPTOP (MISCELLANEOUS) ×3 IMPLANT
SYR 10ML LL (SYRINGE) ×3 IMPLANT
TAPE TRANSPORE STRL 2 31045 (GAUZE/BANDAGES/DRESSINGS) ×2 IMPLANT
WATER STERILE IRR 1000ML POUR (IV SOLUTION) ×3 IMPLANT
WATER STERILE IRR 500ML POUR (IV SOLUTION) ×3 IMPLANT

## 2021-10-10 NOTE — Interval H&P Note (Signed)
History and Physical Interval Note:  10/10/2021 7:23 AM  Andrew Fuentes  has presented today for surgery, with the diagnosis of Prostate Cancer.  The various methods of treatment have been discussed with the patient and family. After consideration of risks, benefits and other options for treatment, the patient has consented to  Procedure(s): RADIOACTIVE SEED IMPLANT/BRACHYTHERAPY IMPLANT (N/A) as a surgical intervention.  The patient's history has been reviewed, patient examined, no change in status, stable for surgery.  I have reviewed the patient's chart and labs.  Questions were answered to the patient's satisfaction.     Andrew Fuentes

## 2021-10-10 NOTE — Anesthesia Procedure Notes (Signed)
Procedure Name: LMA Insertion Date/Time: 10/10/2021 7:39 AM Performed by: Esaw Grandchild, CRNA Pre-anesthesia Checklist: Patient identified, Emergency Drugs available, Suction available and Patient being monitored Patient Re-evaluated:Patient Re-evaluated prior to induction Oxygen Delivery Method: Circle system utilized Preoxygenation: Pre-oxygenation with 100% oxygen Induction Type: IV induction Ventilation: Mask ventilation without difficulty LMA: LMA inserted LMA Size: 5.0 Number of attempts: 1 Placement Confirmation: positive ETCO2 and breath sounds checked- equal and bilateral Tube secured with: Tape Dental Injury: Teeth and Oropharynx as per pre-operative assessment

## 2021-10-10 NOTE — Discharge Instructions (Addendum)
AMBULATORY SURGERY  DISCHARGE INSTRUCTIONS   The drugs that you were given will stay in your system until tomorrow so for the next 24 hours you should not:  Drive an automobile Make any legal decisions Drink any alcoholic beverage   You may resume regular meals tomorrow.  Today it is better to start with liquids and gradually work up to solid foods.  You may eat anything you prefer, but it is better to start with liquids, then soup and crackers, and gradually work up to solid foods.   Please notify your doctor immediately if you have any unusual bleeding, trouble breathing, redness and pain at the surgery site, drainage, fever, or pain not relieved by medication.    Additional Instructions:  May alternate tylenol and ibuprofen to prevent  pain   First dose of ibuprofen at 2:30  Please contact your physician with any problems or Same Day Surgery at [phone], Monday through Friday 6 am to 4 pm, or  at [facility] number at [phone].    Prescriptions for pain medication, medication for urinary symptoms (tamsulosin) and a prophylactic antibiotic were sent to your pharmacy You may resume regular activities in 24 hours Call [place] for fever greater than 101 degrees or any questions/concerns You are scheduled for a follow-up appointment in radiation oncology on 11/14/2021.  You will be contacted for a follow-up appointment at [place].

## 2021-10-10 NOTE — Anesthesia Preprocedure Evaluation (Addendum)
Anesthesia Evaluation  Patient identified by MRN, date of birth, ID band Patient awake    Reviewed: Allergy & Precautions, NPO status , Patient's Chart, lab work & pertinent test results, reviewed documented beta blocker date and time   Airway Mallampati: III  TM Distance: >3 FB Neck ROM: Full    Dental  (+) Chipped   Pulmonary neg pulmonary ROS,    Pulmonary exam normal        Cardiovascular hypertension, Pt. on medications and Pt. on home beta blockers + angina with exertion + CAD  Normal cardiovascular exam     Neuro/Psych negative neurological ROS  negative psych ROS   GI/Hepatic Neg liver ROS, GERD  ,  Endo/Other  negative endocrine ROS  Renal/GU negative Renal ROS  negative genitourinary   Musculoskeletal negative musculoskeletal ROS (+)   Abdominal   Peds negative pediatric ROS (+)  Hematology negative hematology ROS (+)   Anesthesia Other Findings Cancer ([place])    GERD (gastroesophageal reflux disease) Hyperlipidemia    Hypertension    MRSA (methicillin resistant staph aureus) culture positive 2019  Cath 10-10-20 ? RPDA lesion is 75% stenosed.? RPAV lesion is 80% stenosed. ? 1st Diag-1 lesion is 75% stenosed. ? 1st Diag-2 lesion is 100% stenosed. ? 3rd Mrg lesion is 70% stenosed. ? Dist LAD lesion is 90% stenosed. ? The left ventricular systolic function is normal. ? LV end diastolic pressure is normal. ? The left ventricular ejection fraction is 55-65% by visual estimate. ? There is no aortic valve stenosis.   Diffuse, distal vessel and branch vessel disease.  Areas of stenosis are relatively small and not good candidates for PCI.   Plan for medical therapy.  If he had refractory angina, could consider PCI of first diagonal CTO.    Due to difficulty engaging RCA, if repeat cath was done, would use right groin approach.    Will keep him out of work for a week since the end of the week is a  holiday     Reproductive/Obstetrics negative OB ROS                             Anesthesia Physical Anesthesia Plan  ASA: 3  Anesthesia Plan: General   Post-op Pain Management:    Induction: Intravenous  PONV Risk Score and Plan: 2 and Ondansetron, Propofol infusion and Midazolam  Airway Management Planned: LMA  Additional Equipment:   Intra-op Plan:   Post-operative Plan: Extubation in OR  Informed Consent: I have reviewed the patients History and Physical, chart, labs and discussed the procedure including the risks, benefits and alternatives for the proposed anesthesia with the patient or authorized representative who has indicated his/her understanding and acceptance.       Plan Discussed with: CRNA, Anesthesiologist and Surgeon  Anesthesia Plan Comments:        Anesthesia Quick Evaluation

## 2021-10-10 NOTE — Anesthesia Postprocedure Evaluation (Signed)
Anesthesia Post Note  Patient: Andrew Fuentes [facility]  Procedure(s) Performed: RADIOACTIVE SEED IMPLANT/BRACHYTHERAPY IMPLANT  Patient location during evaluation: PACU Anesthesia Type: General Level of consciousness: awake and alert, awake and oriented Pain management: pain level controlled Vital Signs Assessment: post-procedure vital signs reviewed and stable Respiratory status: spontaneous breathing, nonlabored ventilation and respiratory function stable Cardiovascular status: blood pressure returned to baseline and stable Postop Assessment: no apparent nausea or vomiting Anesthetic complications: no   No notable events documented.   Last Vitals:  Vitals:   10/10/21 0900 10/10/21 0915  BP: (!) 137/91 (!) 150/96  Pulse: (!) 55 (!) 56  Resp: 13 16  Temp:    SpO2: 100% 97%    Last Pain:  Vitals:   10/10/21 0915  TempSrc:   PainSc: 0-No pain                 Phill Mutter

## 2021-10-10 NOTE — Op Note (Signed)
Preoperative diagnosis: Adenocarcinoma of the prostate   Postoperative diagnosis: Adenocarcinoma the prostate  Procedure: I-125 prostate interstitial implant, cystoscopy  Surgeon: John Giovanni, M.D.   Radiation oncologist: Lavena Stanford, M.D.   Anesthesia: General  Drains: none  Complications: none  Indications: Indication: Andrew Fuentes is a 65 y.o. patient with intermediate risk prostate cancer who presents today for I-125 transperineal brachytherapy.  After reviewing the management options for treatment, he elected to proceed with the above surgical procedure(s). We have discussed the potential benefits and risks of the procedure, side effects of the proposed treatment, the likelihood of the patient achieving the goals of the procedure, and any potential problems that might occur during the procedure or recuperation. Informed consent has been obtained.  Procedure:  The patient was taken to the operating room and general anesthesia was induced.  The patient was placed in the dorsal lithotomy position, prepped and draped in the usual sterile fashion, and preoperative antibiotics were administered. A preoperative time-out was performed.   Radiation oncology department placed a transrectal ultrasound probe anchoring stand/ grid and aligned with previous imaging from the volume study. Foley catheter was inserted without difficulty.  All needle passage was done with real-time transrectal ultrasound guidance in both the transverse and sagittal plains in order to achieve the desired preplanned position. A total of 28 needles were placed.  86 active seeds were implanted. The Foley catheter was removed and a rigid cystoscopy failed to show any seeds outside the prostate without evidence of trauma to the urethral, prostatic fossa, or bladder.  The bladder was drained.  A fluoroscopic image was then obtained showing excellent distrubution of the brachytherapy seeds.  Each seed was counted and counts  were correct.    After anesthetic reversal he was transported to the PACU in stable condition.  Plan: Void prior to discharge Postop follow-up 1 month   John Giovanni, MD

## 2021-10-10 NOTE — Transfer of Care (Signed)
Immediate Anesthesia Transfer of Care Note  Patient: Andrew Fuentes [facility]  Procedure(s) Performed: RADIOACTIVE SEED IMPLANT/BRACHYTHERAPY IMPLANT  Patient Location: PACU  Anesthesia Type:General  Level of Consciousness: drowsy  Airway & Oxygen Therapy: Patient Spontanous Breathing and Patient connected to face mask oxygen  Post-op Assessment: Report given to RN and Post -op Vital signs reviewed and stable  Post vital signs: Reviewed and stable  Last Vitals:  Vitals Value Taken Time  BP 134/87 10/10/21 0849  Temp    Pulse 55 10/10/21 0853  Resp 11 10/10/21 0853  SpO2 100 % 10/10/21 0853  Vitals shown include unvalidated device data.  Last Pain:  Vitals:   10/10/21 0615  TempSrc: Oral         Complications: No notable events documented.

## 2021-10-11 NOTE — Progress Notes (Signed)
Radiation Oncology Follow up Note  Name: Andrew Fuentes   Date:   08/31/2021 MRN:  [id number] DOB: 10-01-1956    This 65 y.o. 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 provider found  HPI: Patient is a 65 year old male who presented with a PSA in the 6 range he was diagnosed with stage IIb Gleason 7 (3+4) adenocarcinoma the prostate.  He opted for I-125 interstitial implant..  COMPLICATIONS OF TREATMENT: none  FOLLOW UP COMPLIANCE: keeps appointments   PHYSICAL EXAM:  BP 134/88    Pulse (!) 55    Temp (!) 96.4 F (35.8 C) (Temporal)    Resp 16    Ht 6\' 1"  (1.854 m)    Wt 235 lb 4.8 oz (106.7 kg)    SpO2 98%    BMI 31.04 kg/m  Well-developed well-nourished patient in NAD. HEENT reveals PERLA, EOMI, discs not visualized.  Oral cavity is clear. No oral mucosal lesions are identified. Neck is clear without evidence of cervical or supraclavicular adenopathy. Lungs are clear to A&P. Cardiac examination is essentially unremarkable with regular rate and rhythm without murmur rub or thrill. Abdomen is benign with no organomegaly or masses noted. Motor sensory and DTR levels are equal and symmetric in the upper and lower extremities. Cranial nerves II through XII are grossly intact. Proprioception is intact. No peripheral adenopathy or edema is identified. No motor or sensory levels are noted. Crude visual fields are within normal range.  RADIOLOGY RESULTS: Ultrasound used for source placement  PLAN: Patient was taken to the operating room and general anesthesia was administered. Legs were immobilized in stirrups and patient was positioned in the exact same proportions as original volume study. Patient was prepped and Foley catheter was placed. Ultrasound guidance identified the prostate and recreated the original set up as per treatment planning volume study.  A needle grid was attached  to the ultrasound probe to position the needles.  28 needles were placed under ultrasound guidance  to the  prostate PTV  . After completion of procedure cystoscopy was performed by urology and no evidence of seeds in the bladder were noted. Patient tolerated the procedure extremely well. Initial plain film as doublecheck identified 86 seeds in the prostate. Patient has followup appointment in one month for CT scan for quality assurance will be performed.Prior to implant 10% or 10 loose seeds which is ever higher were ordered and assayed to verify source strength.     [name], MD

## 2021-11-13 NOTE — Progress Notes (Signed)
11/14/2021 4:52 PM   [name] 1955/12/06 [id number]  Referring provider: Nicolette Bang, MD [address] [place],  [place] [address]  Chief Complaint  Patient presents with   Prostate Cancer   Urological history 1. Prostate cancer -PSA 5.6 in 12/2020 -Intermediate risk (unfavorable) prostate cancer -6 months ADT given 08/2021 -I-125 interstitial implant 10/10/2021  2. BPH with LU TS -prostate volume 35 cc on prostate ultrasound during prostate biopsy  -PVR 33 mL  HPI: Andrew Fuentes is a 66 y.o. male who presents today for a one month follow up.  His post-procedural course was as expected and uneventful.       PVR 33 mL  He is having nocturia, urgency and urge incontinence.  He was restarted on tamsulosin by Dr. Donella Stade 2-day and he feels that we will provide relief as it has in the past.  Patient denies any modifying or aggravating factors.  Patient denies any gross hematuria, dysuria or suprapubic/flank pain.  Patient denies any fevers, chills, nausea or vomiting.    He is also experiencing hot flashes, but they are tolerable at this time.  He will continue to monitor.  They seem to occur mostly when he is stressed or in a hurry.   PMH: Past Medical History:  Diagnosis Date   Cancer ([place])    GERD (gastroesophageal reflux disease)    Hyperlipidemia    Hypertension    MRSA (methicillin resistant staph aureus) culture positive 2019    Surgical History: Past Surgical History:  Procedure Laterality Date   COLONOSCOPY  2011   [id number], TA x1   LEFT HEART CATH AND CORONARY ANGIOGRAPHY N/A 10/10/2020   Procedure: LEFT HEART CATH AND CORONARY ANGIOGRAPHY;  Surgeon: Jettie Booze, MD;  Location: [facility];  Service: Cardiovascular;  Laterality: N/A;   POLYPECTOMY  2011   TA   PROSTATE BIOPSY  07/21/2021   RADIOACTIVE SEED IMPLANT N/A 10/10/2021   Procedure: RADIOACTIVE SEED IMPLANT/BRACHYTHERAPY IMPLANT;  Surgeon:  Abbie Sons, MD;  Location: [facility] ORS;  Service: Urology;  Laterality: N/A;    Home Medications:  Allergies as of 11/14/2021       Reactions   Wound Dressing Adhesive Itching        Medication List        Accurate as of November 14, 2021 11:59 PM. If you have any questions, ask your nurse or doctor.          aspirin EC 81 MG tablet Take 1 tablet (81 mg total) by mouth daily. Swallow whole. What changed: when to take this   CALCIUM 1000 + D PO Take 1 tablet by mouth daily. (Plant Based)   ibuprofen 200 MG tablet Commonly known as: ADVIL Take 200 mg by mouth every 6 (six) hours as needed (pain.).   leuprolide (6 Month) 45 MG injection Commonly known as: ELIGARD Inject 45 mg into the skin every 6 (six) months.   losartan 25 MG tablet Commonly known as: COZAAR Take 1 tablet (25 mg total) by mouth daily.   metoprolol succinate 25 MG 24 hr tablet Commonly known as: Toprol XL Take 1 tablet (25 mg total) by mouth daily. What changed: when to take this   multivitamin with minerals Tabs tablet Take 1 tablet by mouth every evening. [employer] Multivitamin   PROBIOTIC ADVANCED PO Take 1 capsule by mouth in the morning.   rosuvastatin 40 MG tablet Commonly known as: CRESTOR Take 1 tablet (40 mg  total) by mouth daily. What changed: when to take this   tamsulosin 0.4 MG Caps capsule Commonly known as: FLOMAX Take 1 capsule (0.4 mg total) by mouth daily after supper. What changed: when to take this Changed by: Casper Harrison, RN   TURMERIC PO Take 1 capsule by mouth in the morning.   Zinc 50 MG Tabs Take 50 mg by mouth in the morning.        Allergies:  Allergies  Allergen Reactions   Wound Dressing Adhesive Itching    Family History: Family History  Problem Relation Age of Onset   Colon cancer Neg Hx    Colon polyps Neg Hx    Esophageal cancer Neg Hx    Rectal cancer Neg Hx    Stomach cancer Neg Hx     Social History:  reports that he has never  smoked. He has never used smokeless tobacco. He reports current alcohol use. He reports that he does not use drugs.  ROS: Pertinent ROS in HPI  Physical Exam: BP (!) 149/80    Pulse 66    Ht 6' (1.829 m)    Wt 240 lb (108.9 kg)    BMI 32.55 kg/m   Constitutional:  Well nourished. Alert and oriented, No acute distress. HEENT: [place] AT, mask in place.  Trachea midline Cardiovascular: No clubbing, cyanosis, or edema. Respiratory: Normal respiratory effort, no increased work of breathing. Neurologic: Grossly intact, no focal deficits, moving all 4 extremities. Psychiatric: Normal mood and affect.   Laboratory Data: Lab Results  Component Value Date   WBC 4.6 10/03/2021   HGB 13.1 10/03/2021   HCT 37.2 (L) 10/03/2021   MCV 95.6 10/03/2021   PLT 200 10/03/2021    Lab Results  Component Value Date   CREATININE 0.88 10/03/2021  I have reviewed the labs.   Pertinent Imaging:  11/14/21 14:31  Scan Result 37mL    Assessment & Plan:    1. Prostate cancer -PSA pending -PVR minimal  2. Hot flashes -Manageable at this time -Will alert [place] if they worsen  3. Nocturia -Had success with tamsulosin in the past and has recently restarted -Will alert [place] if tamsulosin is not effective and we will try an overactive bladder agent  Return in about 6 months (around 05/14/2022) for PSA and office visit .  These notes generated with voice recognition software. I apologize for typographical errors.  Zara Council, PA-C  [facility] [address]  [place], [place] [address] [phone]

## 2021-11-14 ENCOUNTER — Other Ambulatory Visit: Payer: Self-pay | Admitting: *Deleted

## 2021-11-14 ENCOUNTER — Encounter: Payer: Self-pay | Admitting: Urology

## 2021-11-14 ENCOUNTER — Other Ambulatory Visit: Payer: Self-pay

## 2021-11-14 ENCOUNTER — Ambulatory Visit
Admission: RE | Admit: 2021-11-14 | Discharge: 2021-11-14 | Disposition: A | Discharge status: Home / Self Care | Payer: BC Managed Care – PPO | Source: Ambulatory Visit | Attending: Radiation Oncology | Admitting: Radiation Oncology

## 2021-11-14 ENCOUNTER — Ambulatory Visit (INDEPENDENT_AMBULATORY_CARE_PROVIDER_SITE_OTHER): Payer: BC Managed Care – PPO | Admitting: Urology

## 2021-11-14 ENCOUNTER — Encounter: Payer: Self-pay | Admitting: Radiation Oncology

## 2021-11-14 VITALS — BP 146/91 | HR 65 | Resp 16 | Wt 236.1 lb

## 2021-11-14 VITALS — BP 149/80 | HR 66 | Ht 72.0 in | Wt 240.0 lb

## 2021-11-14 DIAGNOSIS — Z923 Personal history of irradiation: Secondary | ICD-10-CM | POA: Insufficient documentation

## 2021-11-14 DIAGNOSIS — C61 Malignant neoplasm of prostate: Secondary | ICD-10-CM | POA: Insufficient documentation

## 2021-11-14 DIAGNOSIS — R351 Nocturia: Secondary | ICD-10-CM | POA: Diagnosis not present

## 2021-11-14 DIAGNOSIS — R232 Flushing: Secondary | ICD-10-CM | POA: Insufficient documentation

## 2021-11-14 DIAGNOSIS — Z510 Encounter for antineoplastic radiation therapy: Secondary | ICD-10-CM | POA: Insufficient documentation

## 2021-11-14 LAB — BLADDER SCAN AMB NON-IMAGING

## 2021-11-14 MED ORDER — TAMSULOSIN HCL 0.4 MG PO CAPS: 0.4000 mg | ORAL_CAPSULE | Freq: Every day | ORAL | 11 refills | Status: DC

## 2021-11-14 NOTE — Progress Notes (Signed)
Radiation Oncology Follow up Note  Name: Andrew Fuentes   Date:   11/14/2021 MRN:  [id number] DOB: 11-08-1955    This 66 y.o. 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  REFERRING PROVIDER: Caryl Fuentes*  HPI: Patient is a 66 year old male now out 1 month having completed I-125 interstitial implant for Gleason 7 adenocarcinoma the prostate.  Seen today in routine follow-up he is having some nocturia although he has discontinued Flomax.  Having no GI symptoms.  Continues to have some hot flashes from his Eligard.  We performed CT scan for quality assurance showing excellent source placement..  COMPLICATIONS OF TREATMENT: none  FOLLOW UP COMPLIANCE: keeps appointments   PHYSICAL EXAM:  BP (!) 146/91 (BP Location: Left Arm, Patient Position: Sitting)    Pulse 65    Resp 16    Wt 236 lb 1.6 oz (107.1 kg)    BMI 31.15 kg/m  Well-developed well-nourished patient in NAD. HEENT reveals PERLA, EOMI, discs not visualized.  Oral cavity is clear. No oral mucosal lesions are identified. Neck is clear without evidence of cervical or supraclavicular adenopathy. Lungs are clear to A&P. Cardiac examination is essentially unremarkable with regular rate and rhythm without murmur rub or thrill. Abdomen is benign with no organomegaly or masses noted. Motor sensory and DTR levels are equal and symmetric in the upper and lower extremities. Cranial nerves II through XII are grossly intact. Proprioception is intact. No peripheral adenopathy or edema is identified. No motor or sensory levels are noted. Crude visual fields are within normal range.  RADIOLOGY RESULTS: CT scan reviewed showing excellent source placement.  PLAN: Present time I have asked patient to go back on Flomax as directed.  Also asked him to start taking vitamin D supplements for his hot flashes.  I have assured him his  symptoms will improve after the implant has gone dormant.  I have asked to see him back in 3 months for follow-up with a PSA at that time.  Patient is to call with any concerns.  I would like to take this opportunity to thank you for allowing me to participate in the care of your patient.[name], MD

## 2021-11-15 ENCOUNTER — Encounter: Payer: BC Managed Care – PPO | Admitting: Urology

## 2021-11-15 DIAGNOSIS — Z510 Encounter for antineoplastic radiation therapy: Secondary | ICD-10-CM | POA: Diagnosis not present

## 2021-11-15 DIAGNOSIS — C61 Malignant neoplasm of prostate: Secondary | ICD-10-CM | POA: Diagnosis present

## 2021-11-15 LAB — PSA: Prostate Specific Ag, Serum: 0.3 ng/mL (ref 0.0–4.0)

## 2021-11-16 DIAGNOSIS — C61 Malignant neoplasm of prostate: Secondary | ICD-10-CM | POA: Diagnosis not present

## 2021-12-06 ENCOUNTER — Ambulatory Visit
Admission: EM | Admit: 2021-12-06 | Discharge: 2021-12-06 | Disposition: A | Discharge status: Home / Self Care | Payer: BC Managed Care – PPO | Attending: Internal Medicine | Admitting: Internal Medicine

## 2021-12-06 ENCOUNTER — Other Ambulatory Visit: Payer: Self-pay

## 2021-12-06 DIAGNOSIS — R21 Rash and other nonspecific skin eruption: Secondary | ICD-10-CM | POA: Diagnosis not present

## 2021-12-06 MED ORDER — HYDROCORTISONE 0.5 % EX CREA: 1.0000 "application " | TOPICAL_CREAM | Freq: Two times a day (BID) | CUTANEOUS | 0 refills | Status: DC

## 2021-12-06 MED ORDER — DOXYCYCLINE HYCLATE 100 MG PO CAPS: 100.0000 mg | ORAL_CAPSULE | Freq: Two times a day (BID) | ORAL | 0 refills | Status: DC

## 2021-12-06 NOTE — ED Provider Notes (Signed)
[facility]    CSN: [id number] Arrival date & time: 12/06/21  0934      History   Chief Complaint Chief Complaint  Patient presents with   Rash    HPI Andrew Fuentes is a 66 y.o. male.   Patient presents with itchy facial rash that has been present for approximately 2 to 3 months.  Patient denies any changes in environment including lotions, soaps, detergents, facial washes, foods, etc.  Patient reports that rash slightly improved when he received antibiotics for surgical procedure approximately 1 to 2 months ago.  Rash is not painful.  Denies any known fevers.   Rash  Past Medical History:  Diagnosis Date   Cancer ([place])    GERD (gastroesophageal reflux disease)    Hyperlipidemia    Hypertension    MRSA (methicillin resistant staph aureus) culture positive 2019    Patient Active Problem List   Diagnosis Date Noted   Elevated PSA 10/12/2020   Pure hypercholesterolemia 10/07/2020   Primary hypertension 10/07/2020   Stable angina ([place]) 10/07/2020    Past Surgical History:  Procedure Laterality Date   COLONOSCOPY  2011   [id number], TA x1   LEFT HEART CATH AND CORONARY ANGIOGRAPHY N/A 10/10/2020   Procedure: LEFT HEART CATH AND CORONARY ANGIOGRAPHY;  Surgeon: Jettie Booze, MD;  Location: [place] [facility];  Service: Cardiovascular;  Laterality: N/A;   POLYPECTOMY  2011   TA   PROSTATE BIOPSY  07/21/2021   RADIOACTIVE SEED IMPLANT N/A 10/10/2021   Procedure: RADIOACTIVE SEED IMPLANT/BRACHYTHERAPY IMPLANT;  Surgeon: Abbie Sons, MD;  Location: [facility];  Service: Urology;  Laterality: N/A;       Home Medications    Prior to Admission medications   Medication Sig Start Date End Date Taking? Authorizing Provider  doxycycline (VIBRAMYCIN) 100 MG capsule Take 1 capsule (100 mg total) by mouth 2 (two) times daily. 12/06/21  Yes Zigmund Linse, Hildred Alamin E, FNP  hydrocortisone cream 0.5 % Apply 1 application topically 2 (two) times daily. 12/06/21  Yes  Teodora Medici, FNP  aspirin EC 81 MG tablet Take 1 tablet (81 mg total) by mouth daily. Swallow whole. Patient taking differently: Take 81 mg by mouth every evening. Swallow whole. 10/03/20   Freada Bergeron, MD  Calcium Carb-Cholecalciferol (CALCIUM 1000 + D PO) Take 1 tablet by mouth daily. (Plant Based)    [provider]  ibuprofen (ADVIL) 200 MG tablet Take 200 mg by mouth every 6 (six) hours as needed (pain.).    [provider]  leuprolide, 6 Month, (ELIGARD) 45 MG injection Inject 45 mg into the skin every 6 (six) months.    [provider]  losartan (COZAAR) 25 MG tablet Take 1 tablet (25 mg total) by mouth daily. 10/03/20   Freada Bergeron, MD  metoprolol succinate (TOPROL XL) 25 MG 24 hr tablet Take 1 tablet (25 mg total) by mouth daily. Patient taking differently: Take 25 mg by mouth every evening. 10/03/20   Freada Bergeron, MD  Multiple Vitamin (MULTIVITAMIN WITH MINERALS) TABS tablet Take 1 tablet by mouth every evening. Centum Multivitamin    [provider]  Probiotic Product (PROBIOTIC ADVANCED PO) Take 1 capsule by mouth in the morning.    [provider]  rosuvastatin (CRESTOR) 40 MG tablet Take 1 tablet (40 mg total) by mouth daily. Patient taking differently: Take 40 mg by mouth every evening. 07/05/21   Leanor Kail, PA  tamsulosin (FLOMAX) 0.4 MG CAPS capsule  Take 1 capsule (0.4 mg total) by mouth daily after supper. 11/14/21   [name], MD  TURMERIC PO Take 1 capsule by mouth in the morning.    [provider]  Zinc 50 MG TABS Take 50 mg by mouth in the morning.    [provider]    Family History Family History  Problem Relation Age of Onset   Colon cancer Neg Hx    Colon polyps Neg Hx    Esophageal cancer Neg Hx    Rectal cancer Neg Hx    Stomach cancer Neg Hx     Social History Social History   Tobacco Use   Smoking status: Never   Smokeless tobacco: Never  Vaping  Use   Vaping Use: Never used  Substance Use Topics   Alcohol use: Yes    Comment: less than monthly   Drug use: Never     Allergies   Wound dressing adhesive   Review of Systems Review of Systems Per HPI  Physical Exam Triage Vital Signs ED Triage Vitals  Enc Vitals Group     BP 12/06/21 0943 119/75     Pulse Rate 12/06/21 0943 82     Resp 12/06/21 0943 18     Temp 12/06/21 0943 97.7 F (36.5 C)     Temp Source 12/06/21 0943 Oral     SpO2 12/06/21 0943 97 %     Weight --      Height --      Head Circumference --      Peak Flow --      Pain Score 12/06/21 0944 0     Pain Loc --      Pain Edu? --      Excl. in [place]? --    No data found.  Updated Vital Signs BP 119/75 (BP Location: Left Arm)    Pulse 82    Temp 97.7 F (36.5 C) (Oral)    Resp 18    SpO2 97%   Visual Acuity Right Eye Distance:   Left Eye Distance:   Bilateral Distance:    Right Eye Near:   Left Eye Near:    Bilateral Near:     Physical Exam Constitutional:      General: He is not in acute distress.    Appearance: Normal appearance. He is not toxic-appearing or diaphoretic.  HENT:     Head: Normocephalic and atraumatic.  Eyes:     Extraocular Movements: Extraocular movements intact.     Conjunctiva/sclera: Conjunctivae normal.  Pulmonary:     Effort: Pulmonary effort is normal.  Skin:    Comments: Diffuse papular rash with associated pustular lesions present across nasal bridge, bilateral cheeks of face, forehead.  Neurological:     General: No focal deficit present.     Mental Status: He is alert and oriented to person, place, and time. Mental status is at baseline.  Psychiatric:        Mood and Affect: Mood normal.        Behavior: Behavior normal.        Thought Content: Thought content normal.        Judgment: Judgment normal.     UC Treatments / Results  Labs (all labs ordered are listed, but only abnormal results are displayed) Labs Reviewed - No data to  display  EKG   Radiology No results found.  Procedures Procedures (including critical care time)  Medications Ordered in UC Medications - No data to display  Initial Impression / Assessment and Plan / UC Course  I have reviewed the triage vital signs and the nursing notes.  Pertinent labs & imaging results that were available during my care of the patient were reviewed by me and considered in my medical decision making (see chart for details).     Patient does have history of facial cellulitis.  Although, I am unsure the etiology of patient's facial rash.  It could be contact dermatitis versus infection versus acne given pustular lesions and erythema.  Will opt to treat with hydrocortisone cream as well as doxycycline antibiotic.  Patient to follow-up with dermatology for further evaluation and management given duration of rash.  Patient provided with contact information.  Discussed return precautions.  Patient verbalized understanding and was agreeable with plan. Final Clinical Impressions(s) / UC Diagnoses   Final diagnoses:  Facial rash     Discharge Instructions      You have been prescribed an antibiotic as well as a cream to help alleviate your facial rash.  Please follow-up with dermatology for further evaluation and management.    ED Prescriptions     Medication Sig Dispense Auth. Provider   hydrocortisone cream 0.5 % Apply 1 application topically 2 (two) times daily. 30 g [place], Hildred Alamin E, [place]   doxycycline (VIBRAMYCIN) 100 MG capsule Take 1 capsule (100 mg total) by mouth 2 (two) times daily. 20 capsule Teodora Medici, [place]      PDMP not reviewed this encounter.   Teodora Medici, [place] 12/06/21 1001

## 2021-12-06 NOTE — ED Triage Notes (Signed)
Pt c/o rash to face onset 2-3 months ago. States it itches.

## 2021-12-06 NOTE — Discharge Instructions (Signed)
You have been prescribed an antibiotic as well as a cream to help alleviate your facial rash.  Please follow-up with dermatology for further evaluation and management.

## 2021-12-09 ENCOUNTER — Other Ambulatory Visit: Payer: Self-pay | Admitting: Radiation Oncology

## 2021-12-25 ENCOUNTER — Other Ambulatory Visit: Payer: Self-pay | Admitting: *Deleted

## 2021-12-25 MED ORDER — METOPROLOL SUCCINATE ER 25 MG PO TB24: 25.0000 mg | ORAL_TABLET | Freq: Every day | ORAL | 1 refills | Status: DC

## 2021-12-25 MED ORDER — LOSARTAN POTASSIUM 25 MG PO TABS: 25.0000 mg | ORAL_TABLET | Freq: Every day | ORAL | 1 refills | Status: DC

## 2022-01-04 ENCOUNTER — Other Ambulatory Visit: Payer: Self-pay | Admitting: Physician Assistant

## 2022-01-23 IMAGING — DX DG LUMBAR SPINE COMPLETE 4+V
5 series · 5 of 5 positions shown · non-contrast
Comparison: None.

CLINICAL DATA: Fall from ladder

EXAM:
LUMBAR SPINE - COMPLETE 4+ VIEW

[l-spine ap]
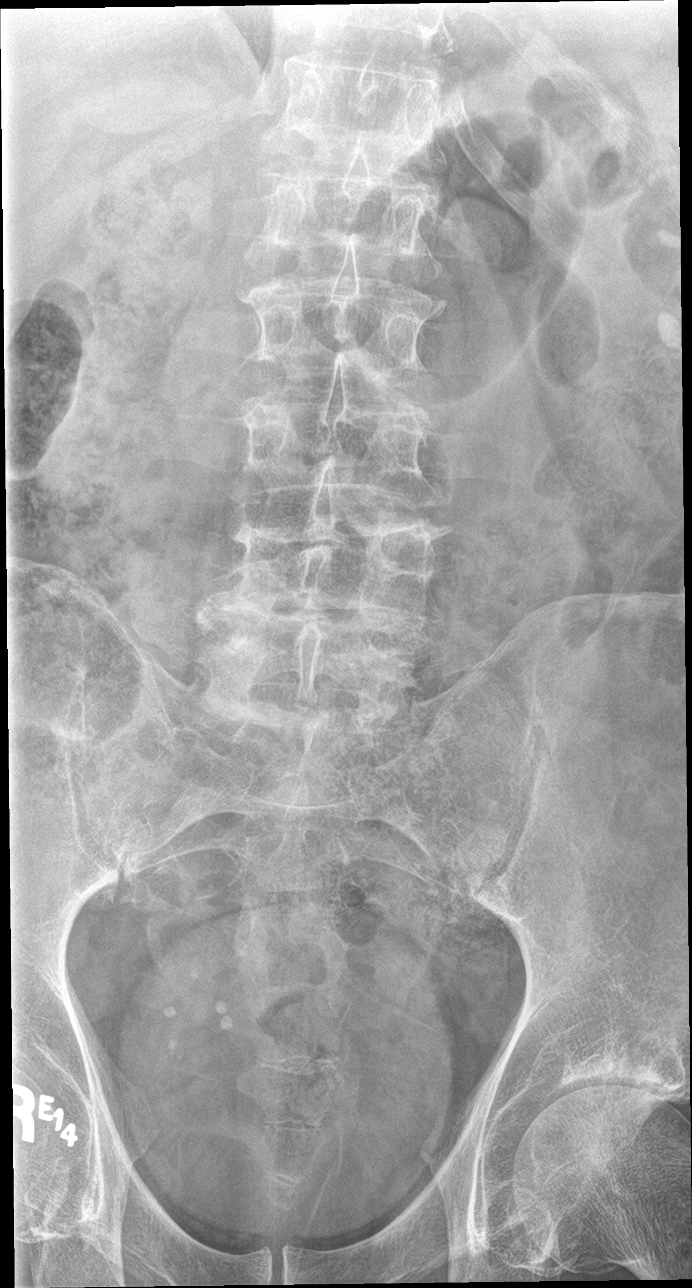

[l-spine obl (1 of 3)]
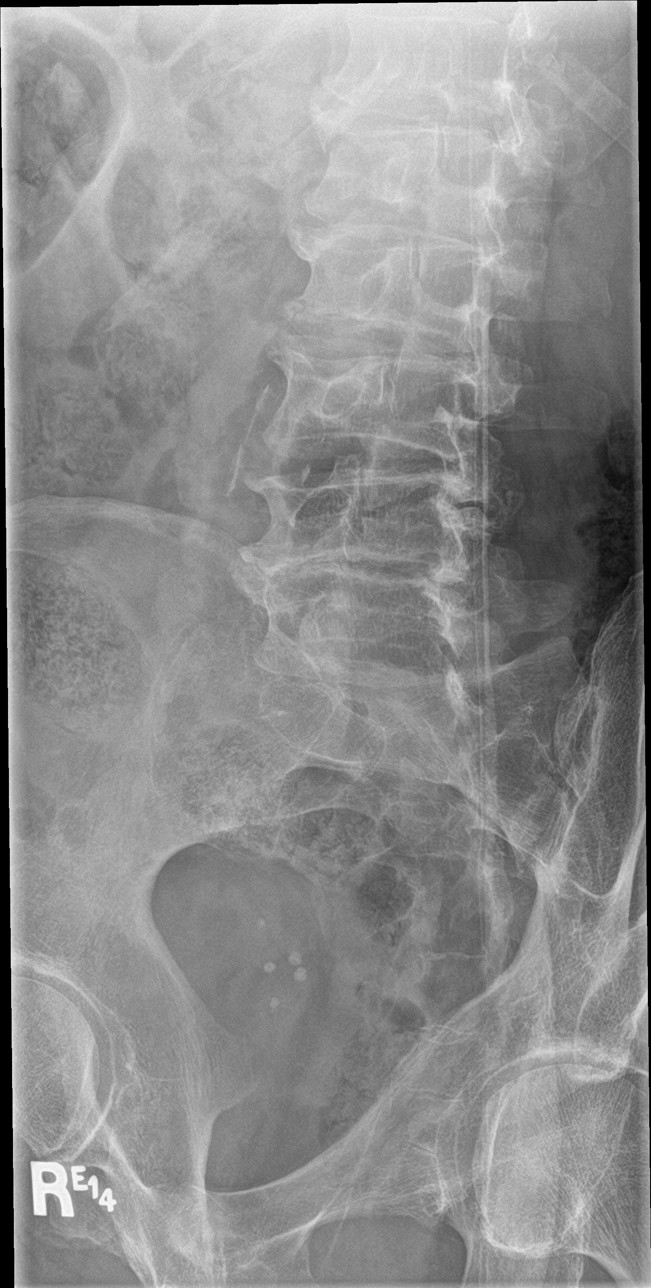

[l-spine obl (2 of 3)]
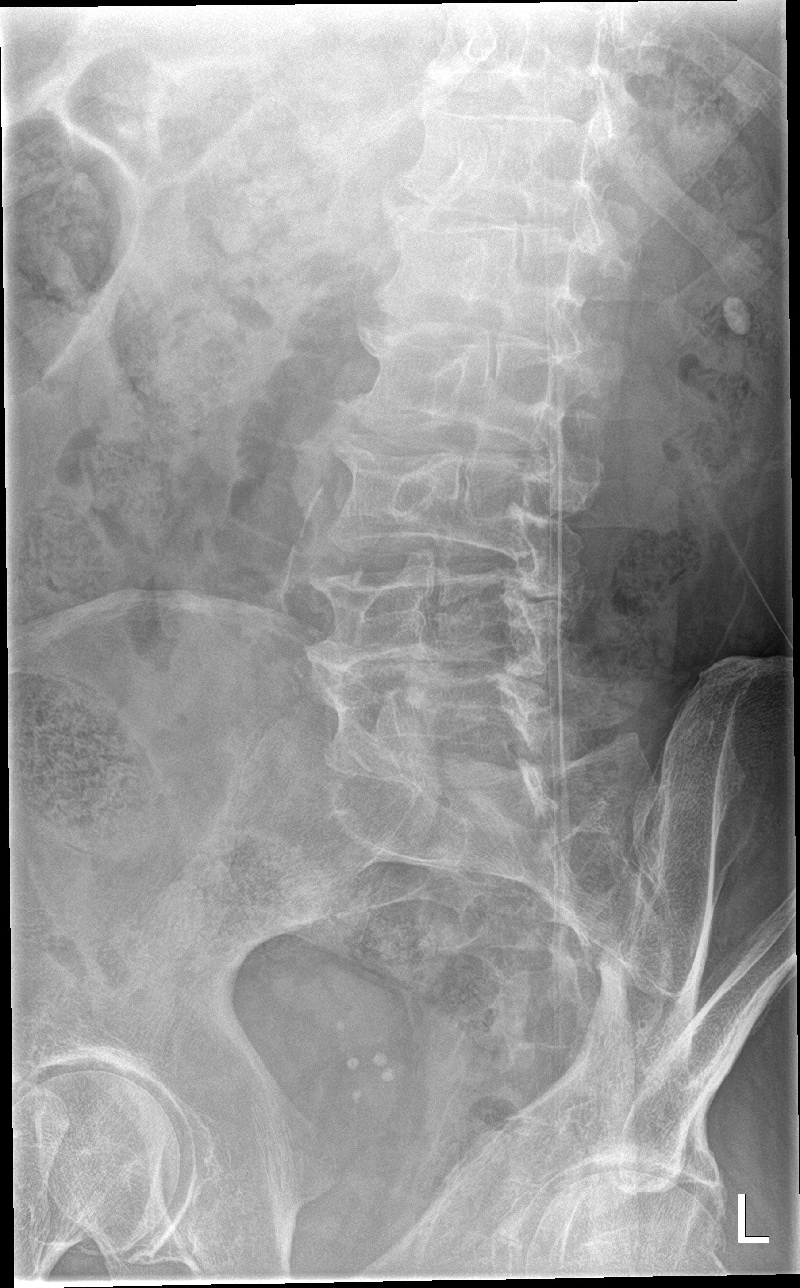

[l-spine lat]
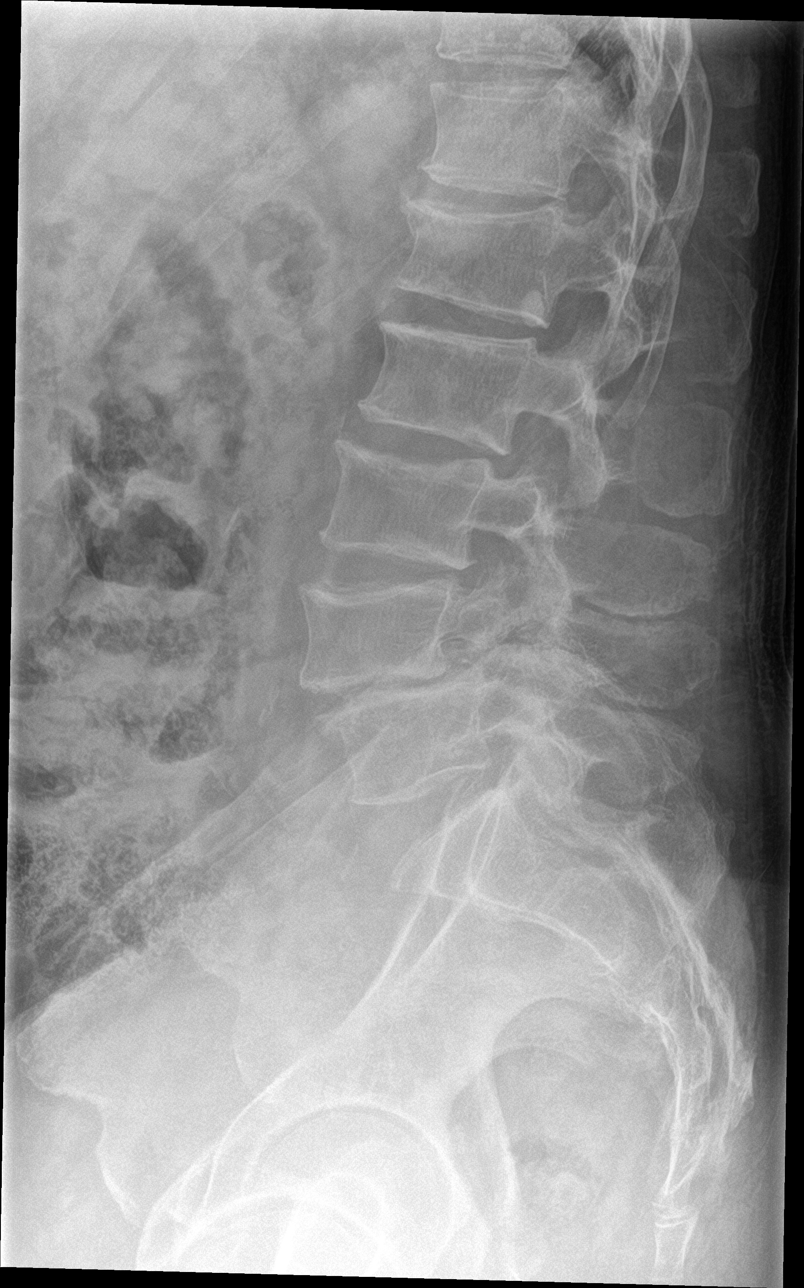

[l-spine obl (3 of 3)]
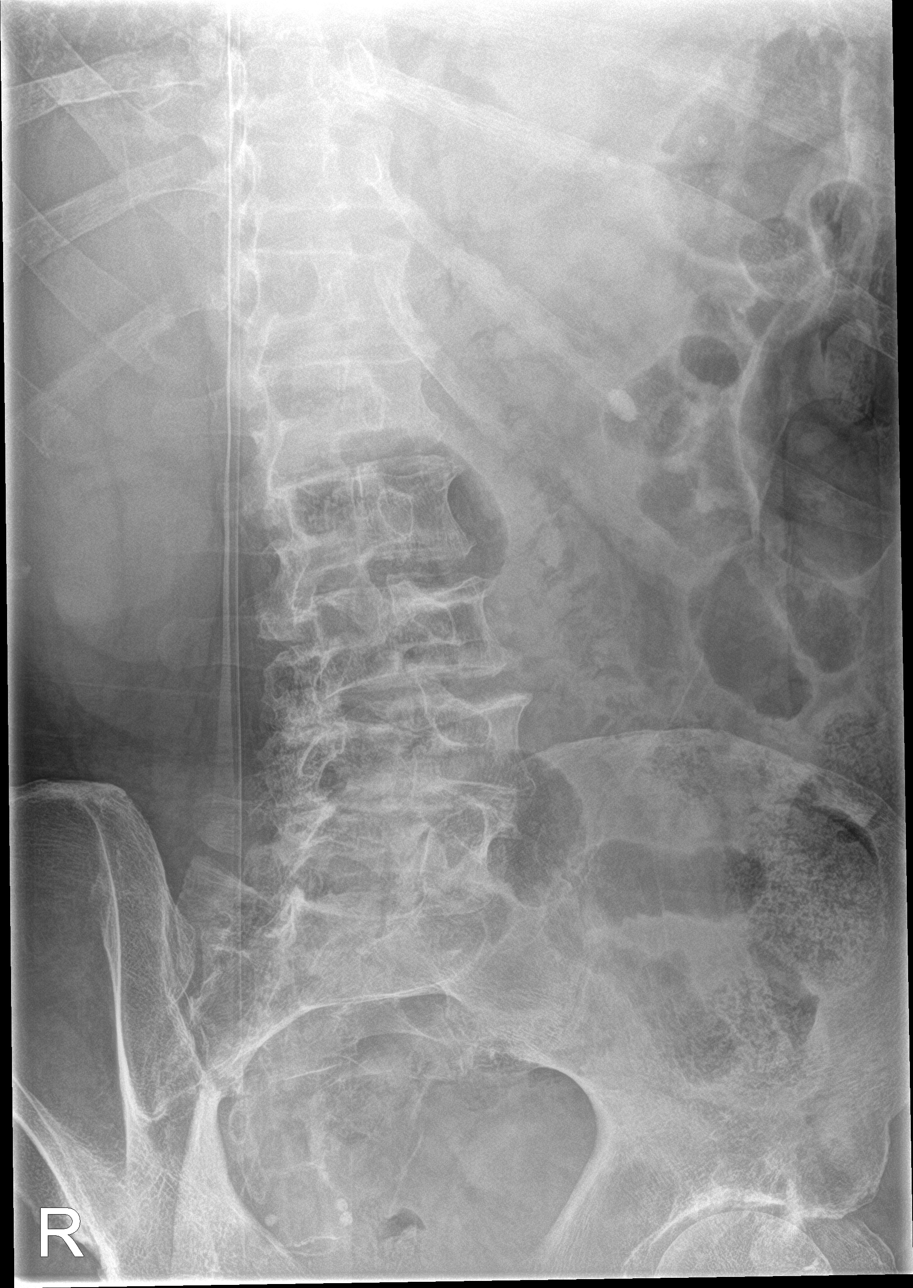

[5 of 5 positions shown; findings below may reference images not displayed]

FINDINGS: Mild buckling of the anterior cortex at L1 with depression of the
superior endplate. Approximately 10% loss vertebral body height this
level. No retropulsion.

Oblique projection demonstrates pars defects at L4. 9 mm
anterolisthesis of L4 on L5.
IMPRESSION: 1. Mild compression fracture at L1 with depression of the superior
endplate.
2. Bilateral spondylolysis at L4 with grade 1 spondylolisthesis of
L4 on L5.

## 2022-02-02 ENCOUNTER — Other Ambulatory Visit: Payer: Self-pay | Admitting: *Deleted

## 2022-02-09 ENCOUNTER — Inpatient Hospital Stay: Payer: BC Managed Care – PPO | Attending: Radiation Oncology

## 2022-02-09 DIAGNOSIS — C61 Malignant neoplasm of prostate: Secondary | ICD-10-CM | POA: Insufficient documentation

## 2022-02-09 LAB — PSA: Prostatic Specific Antigen: 0.01 ng/mL (ref 0.00–4.00)

## 2022-02-16 ENCOUNTER — Other Ambulatory Visit: Payer: Self-pay | Admitting: *Deleted

## 2022-02-16 ENCOUNTER — Encounter: Payer: Self-pay | Admitting: Radiation Oncology

## 2022-02-16 ENCOUNTER — Ambulatory Visit
Admission: RE | Admit: 2022-02-16 | Discharge: 2022-02-16 | Disposition: A | Discharge status: Home / Self Care | Payer: BC Managed Care – PPO | Source: Ambulatory Visit | Attending: Radiation Oncology | Admitting: Radiation Oncology

## 2022-02-16 VITALS — BP 122/73 | HR 66 | Temp 98.8°F | Resp 20 | Wt 237.8 lb

## 2022-02-16 DIAGNOSIS — C61 Malignant neoplasm of prostate: Secondary | ICD-10-CM | POA: Insufficient documentation

## 2022-02-16 DIAGNOSIS — R351 Nocturia: Secondary | ICD-10-CM | POA: Diagnosis not present

## 2022-02-16 DIAGNOSIS — Z923 Personal history of irradiation: Secondary | ICD-10-CM | POA: Diagnosis not present

## 2022-02-16 NOTE — Progress Notes (Signed)
Radiation Oncology ?Follow up Note ? ?Name: Andrew Fuentes [facility]   ?Date:   02/16/2022 ?MRN:  [id number] ?DOB: Mar 21, 1956  ? ? ?This 65 y.o. male presents to the clinic today for 74-monthfollow-up status post I-125 interstitial implant for Gleason 7 (3+4) adenocarcinoma the prostate. ? ?REFERRING PROVIDER: WCaryl Never ? ?HPI: Patient is a 66year old male now out 4 months having completed I-125 interstitial implant for Gleason 7 adenocarcinoma the prostate.  He is seen today in routine follow-up he is still on Flomax still has nocturia x4.  He is having no GI problems.  His most recent PSA is 0.01. ? ?COMPLICATIONS OF TREATMENT: none ? ?FOLLOW UP COMPLIANCE: keeps appointments  ? ?PHYSICAL EXAM:  ?BP 122/73   Pulse 66   Temp 98.8 ?F (37.1 ?C)   Resp 20   Wt 237 lb 12.8 oz (107.9 kg)   SpO2 100%   BMI 32.25 kg/m?  ?Well-developed well-nourished patient in NAD. HEENT reveals PERLA, EOMI, discs not visualized.  Oral cavity is clear. No oral mucosal lesions are identified. Neck is clear without evidence of cervical or supraclavicular adenopathy. Lungs are clear to A&P. Cardiac examination is essentially unremarkable with regular rate and rhythm without murmur rub or thrill. Abdomen is benign with no organomegaly or masses noted. Motor sensory and DTR levels are equal and symmetric in the upper and lower extremities. Cranial nerves II through XII are grossly intact. Proprioception is intact. No peripheral adenopathy or edema is identified. No motor or sensory levels are noted. Crude visual fields are within normal range. ? ?RADIOLOGY RESULTS: No current films for review ? ?PLAN: Present time patient is doing well with excellent biochemical response to implant.  I have asked to see him back in 6 months with a repeat PSA at that time.  I have suggested he tries a much cranberry juice or Azo as possible I have assured him his symptoms should improve over time.  Patient knows to call with any concerns. ? ?I  would like to take this opportunity to thank you for allowing me to participate in the care of your patient.. ?  ? GNoreene Filbert MD ? ?

## 2022-03-02 ENCOUNTER — Other Ambulatory Visit: Payer: Self-pay | Admitting: *Deleted

## 2022-03-02 ENCOUNTER — Ambulatory Visit: Payer: BC Managed Care – PPO | Admitting: Urology

## 2022-03-02 ENCOUNTER — Encounter: Payer: Self-pay | Admitting: Urology

## 2022-03-02 ENCOUNTER — Other Ambulatory Visit
Admission: RE | Admit: 2022-03-02 | Discharge: 2022-03-02 | Disposition: A | Discharge status: Home / Self Care | Payer: BC Managed Care – PPO | Attending: Urology | Admitting: Urology

## 2022-03-02 VITALS — BP 157/74 | HR 73 | Ht 72.0 in | Wt 237.0 lb

## 2022-03-02 DIAGNOSIS — R300 Dysuria: Secondary | ICD-10-CM

## 2022-03-02 DIAGNOSIS — R310 Gross hematuria: Secondary | ICD-10-CM

## 2022-03-02 DIAGNOSIS — R319 Hematuria, unspecified: Secondary | ICD-10-CM | POA: Diagnosis present

## 2022-03-02 LAB — URINALYSIS, COMPLETE (UACMP) WITH MICROSCOPIC
Bilirubin Urine: NEGATIVE
Glucose, UA: NEGATIVE mg/dL
Leukocytes,Ua: NEGATIVE
Nitrite: NEGATIVE
Protein, ur: NEGATIVE mg/dL
Specific Gravity, Urine: >1.03 — ABNORMAL HIGH (ref 1.005–1.030)
pH: 5.5 (ref 5.0–8.0)

## 2022-03-02 NOTE — Patient Instructions (Signed)

## 2022-03-02 NOTE — Progress Notes (Signed)
? ?03/02/22 ?2:31 PM  ? ?Andrew Fuentes ?May 03, 1956 ?[id number] ? ?Referring provider:  ?Nicolette Bang, MD ?[address] ?[address] ?[place],  [place] [address] ?Chief Complaint  ?Patient presents with  ? Dysuria  ? ? ? ?HPI: ?Andrew Fuentes is a 66 y.o.male with a personal history of prostate cancer and BPH with LUTS, who presents today for further evaluation of gross hematuria/ dysuria.  ? ?He is s/p prostate biopsy in 06/2021. Surgical pathology showed 6/12 cores positive Gleason 3+/4+3 adenocarcinoma; all right-sided cores were positive.  ? ?He is s/p radioactive seed implant with brachytherapy on 10/10/2021. He is managed on ADT.  ? ?He report that ever since radioactive seed implant he has had symptoms such as burning during urination. He was told to stop drinking cranberry juice by Dr Baruch Gouty. He had an episode of gross hematuria today prompting this urgent add-on.  He has not had any blood in his urine up to this point. ? ? ?PMH: ?Past Medical History:  ?Diagnosis Date  ? Cancer [facility])   ? GERD (gastroesophageal reflux disease)   ? Hyperlipidemia   ? Hypertension   ? MRSA (methicillin resistant staph aureus) culture positive 2019  ? ? ?Surgical History: ?Past Surgical History:  ?Procedure Laterality Date  ? COLONOSCOPY  2011  ? [id number], TA x1  ? LEFT HEART CATH AND CORONARY ANGIOGRAPHY N/A 10/10/2020  ? Procedure: LEFT HEART CATH AND CORONARY ANGIOGRAPHY;  Surgeon: Jettie Booze, MD;  Location: [place] [facility];  Service: Cardiovascular;  Laterality: N/A;  ? POLYPECTOMY  2011  ? TA  ? PROSTATE BIOPSY  07/21/2021  ? RADIOACTIVE SEED IMPLANT N/A 10/10/2021  ? Procedure: RADIOACTIVE SEED IMPLANT/BRACHYTHERAPY IMPLANT;  Surgeon: Abbie Sons, MD;  Location: [facility];  Service: Urology;  Laterality: N/A;  ? ? ?Home Medications:  ?Allergies as of 03/02/2022   ? ?   Reactions  ? Wound Dressing Adhesive Itching  ? ?  ? ?  ?Medication List  ?  ? ?  ? Accurate as of Mar 02, 2022 11:59 PM. If you  have any questions, ask your nurse or doctor.  ?  ?  ? ?  ? ?STOP taking these medications   ? ?doxycycline 100 MG capsule ?Commonly known as: VIBRAMYCIN ?Stopped by: Hollice Espy, MD ?  ? ?  ? ?TAKE these medications   ? ?aspirin EC 81 MG tablet ?Take 1 tablet (81 mg total) by mouth daily. Swallow whole. ?  ?CALCIUM 1000 + D PO ?Take 1 tablet by mouth daily. (Plant Based) ?  ?hydrocortisone cream 0.5 % ?Apply 1 application topically 2 (two) times daily. ?  ?ibuprofen 200 MG tablet ?Commonly known as: ADVIL ?Take 200 mg by mouth every 6 (six) hours as needed (pain.). ?  ?leuprolide (6 Month) 45 MG injection ?Commonly known as: ELIGARD ?Inject 45 mg into the skin every 6 (six) months. ?  ?losartan 25 MG tablet ?Commonly known as: COZAAR ?Take 1 tablet (25 mg total) by mouth daily. ?  ?metoprolol succinate 25 MG 24 hr tablet ?Commonly known as: Toprol XL ?Take 1 tablet (25 mg total) by mouth daily. ?  ?multivitamin with minerals Tabs tablet ?Take 1 tablet by mouth every evening. Centum Multivitamin ?  ?PROBIOTIC ADVANCED PO ?Take 1 capsule by mouth in the morning. ?  ?rosuvastatin 40 MG tablet ?Commonly known as: CRESTOR ?TAKE 1 TABLET BY MOUTH EVERY DAY ?  ?tamsulosin 0.4 MG Caps capsule ?Commonly known as: FLOMAX ?Take 1 capsule (0.4  mg total) by mouth daily after supper. ?  ?TURMERIC PO ?Take 1 capsule by mouth in the morning. ?  ?Zinc 50 MG Tabs ?Take 50 mg by mouth in the morning. ?  ? ?  ? ? ?Allergies:  ?Allergies  ?Allergen Reactions  ? Wound Dressing Adhesive Itching  ? ? ?Family History: ?Family History  ?Problem Relation Age of Onset  ? Colon cancer Neg Hx   ? Colon polyps Neg Hx   ? Esophageal cancer Neg Hx   ? Rectal cancer Neg Hx   ? Stomach cancer Neg Hx   ? ? ?Social History:  reports that he has never smoked. He has never used smokeless tobacco. He reports current alcohol use. He reports that he does not use drugs. ? ? ?Physical Exam: ?BP (!) 157/74   Pulse 73   Ht 6' (1.829 m)   Wt 237 lb (107.5  kg)   BMI 32.14 kg/m?   ?Constitutional:  Alert and oriented, No acute distress. ?HEENT: [place] AT, moist mucus membranes.  Trachea midline, no masses. ?Cardiovascular: No clubbing, cyanosis, or edema. ?Respiratory: Normal respiratory effort, no increased work of breathing. ?Skin: No rashes, bruises or suspicious lesions. ?Neurologic: Grossly intact, no focal deficits, moving all 4 extremities. ?Psychiatric: Normal mood and affect. ? ?Laboratory Data: ? ?Lab Results  ?Component Value Date  ? CREATININE 0.88 10/03/2021  ? ?No results found for: HGBA1C ? ?Urinalysis ?Component ?    Latest Ref Rng 03/02/2022  ?Glucose, UA ?    NEGATIVE mg/dL NEGATIVE   ?WBC, UA ?    0 - 5 WBC/hpf 0-5   ?Bacteria, UA ?    NONE SEEN  FEW !   ?Color, Urine ?    YELLOW  YELLOW   ?Appearance ?    CLEAR  CLEAR   ?Specific Gravity, Urine ?    1.005 - 1.030  >1.030 (H)   ?pH ?    5.0 - 8.0  5.5   ?Hgb urine dipstick ?    NEGATIVE  SMALL !   ?Bilirubin Urine ?    NEGATIVE  NEGATIVE   ?Ketones, ur ?    NEGATIVE mg/dL TRACE !   ?Protein ?    NEGATIVE mg/dL NEGATIVE   ?Nitrite ?    NEGATIVE  NEGATIVE   ?Leukocytes,Ua ?    NEGATIVE  NEGATIVE   ?Squamous Epithelial / LPF ?    0 - 5  6-10   ?RBC / HPF ?    0 - 5 RBC/hpf 11-20   ?  ?! Abnormal ?(H) High ? ? ?Assessment & Plan:   ?Gross/ microscopic hematuria  ?- UA shows microscopic blood today but no obvious evidence of underlying infection ?- Discussed differential of prostate inflammation from seed implantation  ?- Will have him follow-up with Dr Bernardo Heater to further evaluate with cystoscopy, suspect related to radiation changes however may want to rule out other causes as well as assess for erosion of any brachytherapy seeds, etc. ? ? ?2. Burning during urination ?- Discussed use of NSAIDs and AZO to help with burning during urination  ? ?Follow-up cystoscopy with Dr. Bernardo Heater ? ?Conley Rolls as a Education administrator for Hollice Espy, MD.,have documented all relevant documentation on the behalf of Hollice Espy, MD,as directed by  Hollice Espy, MD while in the presence of Hollice Espy, MD. ? ? ?I have reviewed the above documentation for accuracy and completeness, and I agree with the above.  ? ?Hollice Espy, MD ? ?[place] ?[place]  [address], [address] ?[place], [place] [address] ?[phone] ? ?

## 2022-03-09 ENCOUNTER — Encounter: Payer: Self-pay | Admitting: Urology

## 2022-03-09 ENCOUNTER — Ambulatory Visit: Payer: BC Managed Care – PPO | Admitting: Urology

## 2022-03-09 VITALS — BP 147/89 | HR 63 | Ht 72.0 in | Wt 237.0 lb

## 2022-03-09 DIAGNOSIS — R310 Gross hematuria: Secondary | ICD-10-CM | POA: Diagnosis not present

## 2022-03-09 DIAGNOSIS — R300 Dysuria: Secondary | ICD-10-CM

## 2022-03-09 LAB — URINALYSIS, COMPLETE
Bilirubin, UA: NEGATIVE
Glucose, UA: NEGATIVE
Ketones, UA: NEGATIVE
Leukocytes,UA: NEGATIVE
Nitrite, UA: NEGATIVE
Protein,UA: NEGATIVE
Specific Gravity, UA: 1.025 (ref 1.005–1.030)
Urobilinogen, Ur: 0.2 mg/dL (ref 0.2–1.0)
pH, UA: 7 (ref 5.0–7.5)

## 2022-03-09 LAB — MICROSCOPIC EXAMINATION: Bacteria, UA: NONE SEEN

## 2022-03-09 MED ORDER — LIDOCAINE HCL URETHRAL/MUCOSAL 2 % EX GEL
1.0000 "application " | Freq: Once | CUTANEOUS | Status: AC
  Administered 2022-03-09: 1 via URETHRAL

## 2022-03-09 NOTE — Progress Notes (Signed)
   03/09/22  CC:  Chief Complaint  Patient presents with   Cysto    HPI: Dysuria since brachytherapy and 1 episode of gross hematuria which has not recurred.  Refer to Dr. Cherrie Gauze note 03/02/2022.  Dysuria better since stopping cranberry juice  Blood pressure (!) 147/89, pulse 63, height 6' (1.829 m), weight 237 lb (107.5 kg). NED. A&Ox3.   No respiratory distress   Abd soft, NT, ND Normal phallus with bilateral descended testicles  Cystoscopy Procedure Note  Patient identification was confirmed, informed consent was obtained, and patient was prepped using Betadine solution.  Lidocaine jelly was administered per urethral meatus.     Pre-Procedure: - Inspection reveals a normal caliber urethral meatus.  Procedure: The flexible cystoscope was introduced without difficulty - No urethral strictures/lesions are present. -  No foreign bodies or inflammatory changes  prostate  - Normal bladder neck - Bilateral ureteral orifices identified - Bladder mucosa  reveals no ulcers, tumors, lesions or foreign body - No bladder stones - No trabeculation  Retroflexion shows no abnormalities   Post-Procedure: - Patient tolerated the procedure well  Assessment/ Plan: No abnormalities noted specifically stricture/brachytherapy seeds Given Uribel samples and if effective he will call back for Rx Follow-up appointment is scheduled July 2023   Abbie Sons, MD

## 2022-03-09 NOTE — Patient Instructions (Signed)
Methenamine; Sodium Biphosphate; Phenyl Salicylate; Methylene Blue; Hyoscyamine Capsules or Tablets What is this medication? METHENAMINE; SODIUM BIPHOSPHATE; PHENYL SALICYLATE; METHYLENE BLUE; HYOSCYAMINE (meth EN a meen; SOW dee um bi FOS fate; fen il suh LIS uh leyt; METH uh leen bloo; hye oh SYE a meen) relieves symptoms caused by irritation of the urinary tract, such as pain, burning, and passing frequent small amounts of urine. It works by relaxing the muscles of the bladder, which decreases spasms. It is not an antibiotic. It will not treat a urinary tract infection (UTI). This medicine may be used for other purposes; ask your health care provider or pharmacist if you have questions. COMMON BRAND NAME(S): [place] MB, [place], Hyolev MB, MD 20, MSP Blu, Phosenamine, Phosphasal, UR N-C, Uramit, Urelle, Uretron DS, [place], Urimar-T, Urimax, Urin DS, [place], Uro [place], Uro-L, [place], [place], [place], [place], [place], [place], [place], [place], [place] MB What should I tell my care team before I take this medication? They need to know if you have any of these conditions: Bladder or prostate problems or trouble passing urine Glaucoma Heart disease Myasthenia gravis Stomach problems An unusual or allergic reaction to hyoscyamine; methenamine; methylene blue; phenyl salicylate; sodium biphosphate, other medications, foods, dyes, or preservatives Pregnant or trying to get pregnant Breast-feeding How should I use this medication? Take this medication by mouth with a glass of water. Follow the directions on the prescription label. Take your medication at regular intervals. Do not take it more often than directed. Do not stop taking except on your care team's advice. Talk to your care team about the use of this medication in children. While this medication may be prescribed for children as young as 7 years for selected conditions, precautions do apply. Overdosage: If you think you have taken too much of this  medicine contact a poison control center or emergency room at once. NOTE: This medicine is only for you. Do not share this medicine with others. What if I miss a dose? If you miss a dose, take it as soon as you can. If it is almost time for your next dose, take only that dose. Do not take double or extra doses. What may interact with this medication? Antacids Atropine Antihistamines Certain antibiotics, such as sulfacetamide or sulfamethoxazole Certain medications for bladder problems, such as oxybutynin or tolterodine Certain medications for blood pressure, such as hydrochlorothiazide Certain medications for stomach problems, such as dicyclomine or hyoscyamine Certain medications for travel sickness, such as scopolamine Certain medications for Parkinson disease, such as benztropine or trihexyphenidyl Ipratropium Ketoconazole MAOIs, such as Carbex, Eldepryl, Marplan, Nardil, and Parnate Opioids This list may not describe all possible interactions. Give your health care provider a list of all the medicines, herbs, non-prescription drugs, or dietary supplements you use. Also tell them if you smoke, drink alcohol, or use illegal drugs. Some items may interact with your medicine. What should I watch for while using this medication? Tell your care team if your symptoms do not start to get better or if they get worse. This medication may affect your coordination, reaction time, or judgment. Do not drive or operate machinery until you know how this medication affects you. Sit up or stand slowly to reduce the risk of dizzy or fainting spells. Drinking alcohol with this medication can increase the risk of these side effects. Your mouth may get dry. Chewing sugarless gum or sucking hard candy, and drinking plenty of water may help. Contact your care team if the problem does not go away or  is severe. You will need to be on a special diet while taking this medication. Ask your care team how many glasses  of water or other fluids to drink each day. Also, ask which foods to include and which to avoid to help keep your urine acidic. Your urine must be acidic for this medication to work. This medication may cause dry eyes and blurred vision. If you wear contact lenses you may feel some discomfort. Lubricating drops may help. See your eye care team if the problem does not go away or is severe. What side effects may I notice from receiving this medication? Side effects that you should report to your care team as soon as possible: Allergic reactions--skin rash, itching, hives, swelling of the face, lips, tongue, or throat Blurry vision Change in the amount of urine Fast or irregular heartbeat Trouble passing urine Side effects that usually do not require medical attention (report to your care team if they continue or are bothersome): Blue or blue-green urine or stools Dizziness Drowsiness Dry mouth Flushing Nausea Vomiting This list may not describe all possible side effects. Call your doctor for medical advice about side effects. You may report side effects to FDA at 1-800-FDA-1088. Where should I keep my medication? Keep out of the reach of children and pets. Store at room temperature between 15 and 30 degrees C (59 and 86 degrees F). Throw away any unused medication after the expiration date. NOTE: This sheet is a summary. It may not cover all possible information. If you have questions about this medicine, talk to your doctor, pharmacist, or health care provider.  2023 Elsevier/Gold Standard (2021-05-25 00:00:00)

## 2022-03-16 ENCOUNTER — Other Ambulatory Visit: Payer: BC Managed Care – PPO

## 2022-03-20 ENCOUNTER — Ambulatory Visit: Payer: BC Managed Care – PPO | Admitting: Urology

## 2022-05-04 ENCOUNTER — Other Ambulatory Visit: Payer: Self-pay

## 2022-05-04 MED ORDER — METOPROLOL SUCCINATE ER 25 MG PO TB24: 25.0000 mg | ORAL_TABLET | Freq: Every day | ORAL | 0 refills | Status: DC

## 2022-05-04 MED ORDER — LOSARTAN POTASSIUM 25 MG PO TABS: 25.0000 mg | ORAL_TABLET | Freq: Every day | ORAL | 0 refills | Status: DC

## 2022-05-10 ENCOUNTER — Other Ambulatory Visit: Payer: Self-pay

## 2022-05-10 DIAGNOSIS — C61 Malignant neoplasm of prostate: Secondary | ICD-10-CM

## 2022-05-11 ENCOUNTER — Other Ambulatory Visit: Payer: BC Managed Care – PPO

## 2022-05-11 DIAGNOSIS — C61 Malignant neoplasm of prostate: Secondary | ICD-10-CM

## 2022-05-12 LAB — PSA: Prostate Specific Ag, Serum: <0.1 ng/mL (ref 0.0–4.0)

## 2022-05-16 ENCOUNTER — Telehealth: Payer: BC Managed Care – PPO | Admitting: Physician Assistant

## 2022-05-16 DIAGNOSIS — W57XXXA Bitten or stung by nonvenomous insect and other nonvenomous arthropods, initial encounter: Secondary | ICD-10-CM

## 2022-05-16 NOTE — Progress Notes (Signed)
Patient is out of state 

## 2022-05-18 ENCOUNTER — Ambulatory Visit: Payer: BC Managed Care – PPO | Admitting: Urology

## 2022-05-22 NOTE — Progress Notes (Unsigned)
05/23/2022 8:14 PM   Andrew Fuentes 02/24/1956 [id number]  Referring provider: Nicolette Bang, MD [address] [place],  [place] [address]  Urological history 1. Prostate cancer -PSA, 04/2022 - <0.1 -Intermediate risk (unfavorable) prostate cancer -6 months ADT given 08/2021 -I-125 interstitial implant 10/10/2021  2. BPH with LU TS -prostate volume 35 cc on prostate ultrasound during prostate biopsy  -PVR 33 mL  3. High risk hematuria -Non-smoker -cysto, 02/2022 - NED -no reports of gross heme -UA, 02/2022- negative for micro heme  HPI: Andrew Fuentes is a 66 y.o. male who presents today for a six month follow up.    Score:  1-7 Mild 8-19 Moderate 20-35 Severe   PMH: Past Medical History:  Diagnosis Date   Cancer ([place])    GERD (gastroesophageal reflux disease)    Hyperlipidemia    Hypertension    MRSA (methicillin resistant staph aureus) culture positive 2019    Surgical History: Past Surgical History:  Procedure Laterality Date   COLONOSCOPY  2011   [id number], TA x1   LEFT HEART CATH AND CORONARY ANGIOGRAPHY N/A 10/10/2020   Procedure: LEFT HEART CATH AND CORONARY ANGIOGRAPHY;  Surgeon: Jettie Booze, MD;  Location: [facility];  Service: Cardiovascular;  Laterality: N/A;   POLYPECTOMY  2011   TA   PROSTATE BIOPSY  07/21/2021   RADIOACTIVE SEED IMPLANT N/A 10/10/2021   Procedure: RADIOACTIVE SEED IMPLANT/BRACHYTHERAPY IMPLANT;  Surgeon: Abbie Sons, MD;  Location: [facility];  Service: Urology;  Laterality: N/A;    Home Medications:  Allergies as of 05/23/2022       Reactions   Wound Dressing Adhesive Itching        Medication List        Accurate as of May 22, 2022  8:14 PM. If you have any questions, ask your nurse or doctor.          aspirin EC 81 MG tablet Take 1 tablet (81 mg total) by mouth daily. Swallow whole.   CALCIUM 1000 + D PO Take 1 tablet by mouth daily. (Plant Based)    hydrocortisone cream 0.5 % Apply 1 application topically 2 (two) times daily.   ibuprofen 200 MG tablet Commonly known as: ADVIL Take 200 mg by mouth every 6 (six) hours as needed (pain.).   leuprolide (6 Month) 45 MG injection Commonly known as: ELIGARD Inject 45 mg into the skin every 6 (six) months.   losartan 25 MG tablet Commonly known as: COZAAR Take 1 tablet (25 mg total) by mouth daily. Pt. Need an appt. With Cardiologist in order to receive future refills. Thank You. 1st Attempt.   metoprolol succinate 25 MG 24 hr tablet Commonly known as: Toprol XL Take 1 tablet (25 mg total) by mouth daily. Please make appt. With Cardiologist in order to receive future refills. Thank You. 1st Attempt.   multivitamin with minerals Tabs tablet Take 1 tablet by mouth every evening. Centum Multivitamin   PROBIOTIC ADVANCED PO Take 1 capsule by mouth in the morning.   rosuvastatin 40 MG tablet Commonly known as: CRESTOR TAKE 1 TABLET BY MOUTH EVERY DAY   tamsulosin 0.4 MG Caps capsule Commonly known as: FLOMAX Take 1 capsule (0.4 mg total) by mouth daily after supper.   TURMERIC PO Take 1 capsule by mouth in the morning.   Zinc 50 MG Tabs Take 50 mg by mouth in the morning.        Allergies:  Allergies  Allergen Reactions   Wound Dressing Adhesive Itching    Family History: Family History  Problem Relation Age of Onset   Colon cancer Neg Hx    Colon polyps Neg Hx    Esophageal cancer Neg Hx    Rectal cancer Neg Hx    Stomach cancer Neg Hx     Social History:  reports that he has never smoked. He has never used smokeless tobacco. He reports current alcohol use. He reports that he does not use drugs.  ROS: Pertinent ROS in HPI  Physical Exam: There were no vitals taken for this visit.  Constitutional:  Well nourished. Alert and oriented, No acute distress. HEENT: [place] AT, moist mucus membranes.  Trachea midline Cardiovascular: No clubbing, cyanosis, or  edema. Respiratory: Normal respiratory effort, no increased work of breathing. GU: No CVA tenderness.  No bladder fullness or masses.  Patient with circumcised/uncircumcised phallus. ***Foreskin easily retracted***  Urethral meatus is patent.  No penile discharge. No penile lesions or rashes. Scrotum without lesions, cysts, rashes and/or edema.  Testicles are located scrotally bilaterally. No masses are appreciated in the testicles. Left and right epididymis are normal. Rectal: Patient with  normal sphincter tone. Anus and perineum without scarring or rashes. No rectal masses are appreciated. Prostate is approximately *** grams, *** nodules are appreciated. Seminal vesicles are normal. Neurologic: Grossly intact, no focal deficits, moving all 4 extremities. Psychiatric: Normal mood and affect.   Laboratory Data:  Prostate Specific Ag, Serum  Latest Ref Rng 0.0 - 4.0 ng/mL  10/07/2020 6.0 (H)   12/26/2020 5.6 (H)   11/14/2021 0.3   05/11/2022 <0.1     Legend: (H) High I have reviewed the labs.   Pertinent Imaging: N/A  Assessment & Plan:    1. Prostate cancer -PSA undetectable  2. Hot flashes -Manageable at this time -Will alert [place] if they worsen  3. Nocturia -Had success with tamsulosin in the past and has recently restarted -Will alert [place] if tamsulosin is not effective and we will try an overactive bladder agent  4. High risk hematuria -non-smoker -cysto, 02/2022 - NED  No follow-ups on file.  These notes generated with voice recognition software. I apologize for typographical errors.  Zara Council, PA-C  [facility] [address]  [place], [place] [address] [phone]

## 2022-05-23 ENCOUNTER — Encounter: Payer: Self-pay | Admitting: Urology

## 2022-05-23 ENCOUNTER — Ambulatory Visit (INDEPENDENT_AMBULATORY_CARE_PROVIDER_SITE_OTHER): Payer: No Typology Code available for payment source | Admitting: Urology

## 2022-05-23 ENCOUNTER — Other Ambulatory Visit: Payer: Self-pay

## 2022-05-23 VITALS — BP 145/79 | HR 60 | Ht 72.0 in | Wt 239.0 lb

## 2022-05-23 DIAGNOSIS — N401 Enlarged prostate with lower urinary tract symptoms: Secondary | ICD-10-CM | POA: Diagnosis not present

## 2022-05-23 DIAGNOSIS — R351 Nocturia: Secondary | ICD-10-CM

## 2022-05-23 DIAGNOSIS — C61 Malignant neoplasm of prostate: Secondary | ICD-10-CM

## 2022-05-23 DIAGNOSIS — R232 Flushing: Secondary | ICD-10-CM | POA: Diagnosis not present

## 2022-05-23 DIAGNOSIS — R319 Hematuria, unspecified: Secondary | ICD-10-CM

## 2022-05-23 DIAGNOSIS — Z8546 Personal history of malignant neoplasm of prostate: Secondary | ICD-10-CM | POA: Diagnosis not present

## 2022-06-04 ENCOUNTER — Other Ambulatory Visit: Payer: Self-pay

## 2022-06-04 MED ORDER — LOSARTAN POTASSIUM 25 MG PO TABS: 25.0000 mg | ORAL_TABLET | Freq: Every day | ORAL | 1 refills | Status: DC

## 2022-06-04 MED ORDER — METOPROLOL SUCCINATE ER 25 MG PO TB24: 25.0000 mg | ORAL_TABLET | Freq: Every day | ORAL | 1 refills | Status: DC

## 2022-06-04 NOTE — Addendum Note (Signed)
Addended by: Carter Kitten D on: 06/04/2022 08:36 AM   Modules accepted: Orders

## 2022-08-17 ENCOUNTER — Inpatient Hospital Stay: Payer: BC Managed Care – PPO | Attending: Radiation Oncology

## 2022-08-17 DIAGNOSIS — C61 Malignant neoplasm of prostate: Secondary | ICD-10-CM | POA: Insufficient documentation

## 2022-08-17 LAB — PSA: Prostatic Specific Antigen: 0.03 ng/mL (ref 0.00–4.00)

## 2022-08-24 ENCOUNTER — Ambulatory Visit: Payer: BC Managed Care – PPO | Admitting: Radiation Oncology

## 2022-08-27 ENCOUNTER — Other Ambulatory Visit: Payer: Self-pay | Admitting: *Deleted

## 2022-08-27 ENCOUNTER — Encounter: Payer: Self-pay | Admitting: Radiation Oncology

## 2022-08-27 ENCOUNTER — Ambulatory Visit
Admission: RE | Admit: 2022-08-27 | Discharge: 2022-08-27 | Disposition: A | Discharge status: Home / Self Care | Payer: PPO | Source: Ambulatory Visit | Attending: Radiation Oncology | Admitting: Radiation Oncology

## 2022-08-27 VITALS — BP 110/73 | HR 57 | Temp 96.1°F | Resp 16 | Ht 72.0 in | Wt 238.0 lb

## 2022-08-27 DIAGNOSIS — C61 Malignant neoplasm of prostate: Secondary | ICD-10-CM | POA: Diagnosis present

## 2022-08-27 DIAGNOSIS — Z923 Personal history of irradiation: Secondary | ICD-10-CM | POA: Insufficient documentation

## 2022-08-27 NOTE — Progress Notes (Signed)
Radiation Oncology Follow up Note  Name: Andrew Fuentes   Date:   08/27/2022 MRN:  [id number] DOB: 1956/01/11    This 66 y.o. male presents to the clinic today for 25-monthfollow-up status post I-125 interstitial implant for Gleason 7 (3+4) adenocarcinoma the prostate.  REFERRING PROVIDER: WCaryl Never  HPI: Patient is a 66year old male now out 10 months having completed I-125 interstitial implant for Gleason 7 adenocarcinoma the prostate.  Seen today in routine follow-up he is doing well specifically denies any increased lower urinary tract symptoms diarrhea or fatigue..Marland Kitchen His most recent PSA is 02.24  COMPLICATIONS OF TREATMENT: none  FOLLOW UP COMPLIANCE: keeps appointments   PHYSICAL EXAM:  BP 110/73   Pulse (!) 57   Temp (!) 96.1 F (35.6 C)   Resp 16   Ht 6' (1.829 m)   Wt 238 lb (108 kg)   BMI 32.28 kg/m  Well-developed well-nourished patient in NAD. HEENT reveals PERLA, EOMI, discs not visualized.  Oral cavity is clear. No oral mucosal lesions are identified. Neck is clear without evidence of cervical or supraclavicular adenopathy. Lungs are clear to A&P. Cardiac examination is essentially unremarkable with regular rate and rhythm without murmur rub or thrill. Abdomen is benign with no organomegaly or masses noted. Motor sensory and DTR levels are equal and symmetric in the upper and lower extremities. Cranial nerves II through XII are grossly intact. Proprioception is intact. No peripheral adenopathy or edema is identified. No motor or sensory levels are noted. Crude visual fields are within normal range.  RADIOLOGY RESULTS: No current films for review  PLAN: Present time he is under excellent biochemical control of his prostate cancer.  Of asked to see him back in 6 months with a PSA at that time.  Patient knows to call with any concerns at any time.  I would like to take this opportunity to thank you for allowing me to participate in the care of your  patient..[name] MD

## 2022-09-03 NOTE — Progress Notes (Unsigned)
Cardiology Office Note:    Date:  09/05/2022   ID:  Andrew Fuentes, DOB 17-Jan-1956, MRN [id number]  PCP:  Nicolette Bang, MD  [facility] Cardiologist:  Freada Bergeron, MD  [facility] Electrophysiologist:  None   Referring MD: Caryl Never*     History of Present Illness:    Andrew Fuentes is a 66 y.o. male with history of CAD, HTN, and HLD who presents to clinic for follow-up.  Was initially seen in 09/2020 for chest pain. Symptoms were concerning for stable angina.  Follow-up cardiac catheterization by Dr. Irish Lack 10/10/2020 showed diffuse, distal vessel and branch vessel disease. Areas of stenosis are relatively small and not good candidates for PCI.  Recommended medical therapy.  If refractory angina could consider PCI of first diagonal CTO. Echo 10/2020 showed LVEF of 60-65% and grade 1 DD.   Was last seen in clinic by [facility] as a pre-op visit. Was stable from a CV standpoint at that time.   Past Medical History:  Diagnosis Date   Cancer ([place] [facility])    GERD (gastroesophageal reflux disease)    Hyperlipidemia    Hypertension    MRSA (methicillin resistant staph aureus) culture positive 2019    Past Surgical History:  Procedure Laterality Date   COLONOSCOPY  2011   [id number], TA x1   LEFT HEART CATH AND CORONARY ANGIOGRAPHY N/A 10/10/2020   Procedure: LEFT HEART CATH AND CORONARY ANGIOGRAPHY;  Surgeon: Jettie Booze, MD;  Location: [facility];  Service: Cardiovascular;  Laterality: N/A;   POLYPECTOMY  2011   TA   PROSTATE BIOPSY  07/21/2021   RADIOACTIVE SEED IMPLANT N/A 10/10/2021   Procedure: RADIOACTIVE SEED IMPLANT/BRACHYTHERAPY IMPLANT;  Surgeon: Abbie Sons, MD;  Location: [facility];  Service: Urology;  Laterality: N/A;    Current Medications: Current Meds  Medication Sig   aspirin EC 81 MG tablet Take 1 tablet (81 mg total) by mouth daily. Swallow whole.   Calcium Carb-Cholecalciferol (CALCIUM 1000 + D  PO) Take 1 tablet by mouth daily. (Plant Based)   ibuprofen (ADVIL) 200 MG tablet Take 200 mg by mouth every 6 (six) hours as needed (pain.).   losartan (COZAAR) 25 MG tablet Take 1 tablet (25 mg total) by mouth daily.   metoprolol succinate (TOPROL XL) 25 MG 24 hr tablet Take 1 tablet (25 mg total) by mouth daily.   Multiple Vitamin (MULTIVITAMIN WITH MINERALS) TABS tablet Take 1 tablet by mouth every evening. Centum Multivitamin   Probiotic Product (PROBIOTIC ADVANCED PO) Take 1 capsule by mouth in the morning.   tamsulosin (FLOMAX) 0.4 MG CAPS capsule Take 1 capsule (0.4 mg total) by mouth daily after supper.   TURMERIC PO Take 1 capsule by mouth in the morning.   Zinc 50 MG TABS Take 50 mg by mouth in the morning.     Allergies:   Wound dressing adhesive   Social History   Socioeconomic History   Marital status: Married    Spouse name: Not on file   Number of children: Not on file   Years of education: Not on file   Highest education level: Not on file  Occupational History   Not on file  Tobacco Use   Smoking status: Never   Smokeless tobacco: Never  Vaping Use   Vaping Use: Never used  Substance and Sexual Activity   Alcohol use: Yes    Comment: less than monthly   Drug use: Never   Sexual  activity: Yes    Birth control/protection: None  Other Topics Concern   Not on file  Social History Narrative   Not on file   Social Determinants of Health   Financial Resource Strain: Not on file  Food Insecurity: Not on file  Transportation Needs: Not on file  Physical Activity: Not on file  Stress: Not on file  Social Connections: Not on file     Family History: Father with MI at age 40.  ROS:   Please see the history of present illness.    Review of Systems  Constitutional:  Negative for chills and fever.  HENT:  Negative for congestion.   Eyes:  Negative for blurred vision.  Respiratory:  Negative for shortness of breath.   Cardiovascular:  Positive for chest  pain. Negative for palpitations, orthopnea, claudication, leg swelling and PND.  Gastrointestinal:  Negative for nausea and vomiting.  Genitourinary:  Negative for hematuria.  Musculoskeletal:  Positive for joint pain.  Neurological:  Negative for dizziness and loss of consciousness.  Endo/Heme/Allergies:  Negative for polydipsia.  Psychiatric/Behavioral:  Negative for substance abuse.     EKGs/Labs/Other Studies Reviewed:    The following studies were reviewed today: Echo 10/2020 IMPRESSIONS     1. Left ventricular ejection fraction, by estimation, is 60 to 65%. The  left ventricle has normal function. The left ventricle has no regional  wall motion abnormalities. Left ventricular diastolic parameters are  consistent with Grade I diastolic  dysfunction (impaired relaxation).   2. Right ventricular systolic function is normal. The right ventricular  size is normal. Tricuspid regurgitation signal is inadequate for assessing  PA pressure.   3. Left atrial size was mild to moderately dilated.   4. The mitral valve is normal in structure. Trivial mitral valve  regurgitation.   5. The aortic valve is grossly normal. Aortic valve regurgitation is not  visualized. No aortic stenosis is present.   Comparison(s): No prior Echocardiogram.   Conclusion(s)/Recommendation(s): Normal biventricular function without  evidence of hemodynamically significant valvular heart disease.    LEFT HEART CATH AND CORONARY ANGIOGRAPHY    Conclusion     RPDA lesion is 75% stenosed. RPAV lesion is 80% stenosed. 1st Diag-1 lesion is 75% stenosed. 1st Diag-2 lesion is 100% stenosed. 3rd Mrg lesion is 70% stenosed. Dist LAD lesion is 90% stenosed. The left ventricular systolic function is normal. LV end diastolic pressure is normal. The left ventricular ejection fraction is 55-65% by visual estimate. There is no aortic valve stenosis.   Diffuse, distal vessel and branch vessel disease.  Areas of  stenosis are relatively small and not good candidates for PCI.    Plan for medical therapy.  If he had refractory angina, could consider PCI of first diagonal CTO.     Due to difficulty engaging RCA, if repeat cath was done, would use right groin approach.     Will keep him out of work for a week since the end of the week is a holiday.   Diagnostic Dominance: Right      CTA with abdomen/pelvis: Findings:  Intact abdominal aorta.  Mild tortuosity without  significant atherosclerosis.  Negative for aneurysm, dissection or  occlusive disease.  Celiac, SMA, and IMA are all patent off of the  aorta.  No mesenteric occlusive vascular disease centrally.  Single  renal arteries are identified and widely patent.  No proximal or  ostial renal artery stenosis.  No evidence of mesenteric vascular  aneurysm by CTA.  Splenic artery  is noted to be tortuous as it  courses posterior to the proximal stomach which may account for the  endoscopic findings.  Otherwise, the stomach wall appears uniform.  No vascular abnormality demonstrated within the wall of stomach.  Stomach is under distended.  No significant hernia.     Additional axial imaging:  Minimal hypoventilatory changes of the  lower lobes.  Normal heart size.  No pericardial or pleural  effusion.     Calcified splenic granulomata are noted.  Liver, gallbladder,  biliary system, adrenal glands, and pancreas within normal limits  for age.  Right kidney demonstrates cortical low density cystic  lesions consistent with renal cysts, largest measures 2 cm in the  lower pole.  The left kidney demonstrates a nonobstructing midpole  7 mm calculus, image 49.  Normal symmetric renal enhancement and  excretion.  No bowel obstruction pattern, dilatation, or ileus.  Retained stool throughout the colon.  No abdominal acute  inflammation, fluid collection, free fluid, abscess, or hemorrhage.  Bilateral L4 pars defects are noted with minimal anterior  listhesis  and moderately severe degenerative change at this level as well.      Review of the MIP images confirms the above findings.     IMPRESSION:  No evidence of intra abdominal mesenteric aneurysm.     Prominent tortuous splenic artery courses posterior to the stomach  and may account for the described endoscopic findings.     No evidence of mesenteric or renal vascular disease.     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   EKG:  EKG is  ordered today.  The ekg ordered today demonstrates NSR with LVH. HR 61.  Recent Labs: 10/03/2021: BUN 23; Creatinine, Ser 0.88; Hemoglobin 13.1; Platelets 200; Potassium 4.0; Sodium 136  Recent Lipid Panel    Component Value Date/Time   CHOL 137 07/07/2021 0738   TRIG 131 07/07/2021 0738   HDL 45 07/07/2021 0738   CHOLHDL 3.0 07/07/2021 0738   CHOLHDL 5 11/22/2009 1539   VLDL 18.4 11/22/2009 1539   LDLCALC 69 07/07/2021 0738   LDLDIRECT 182.4 11/22/2009 1539      Physical Exam:    VS:  BP (!) 142/98   Pulse (!) 57   Ht 6' (1.829 m)   Wt 236 lb (107 kg)   SpO2 95%   BMI 32.01 kg/m     Wt Readings from Last 3 Encounters:  09/05/22 236 lb (107 kg)  08/27/22 238 lb (108 kg)  05/23/22 239 lb (108.4 kg)     GEN:  Well nourished, well developed in no acute distress HEENT: Normal NECK: No JVD; No carotid bruits CARDIAC: RRR, no murmurs, rubs, gallops RESPIRATORY:  Clear to auscultation without rales, wheezing or rhonchi  ABDOMEN: Soft, non-tender, non-distended MUSCULOSKELETAL:  No edema; No deformity  SKIN: Warm and dry NEUROLOGIC:  Alert and oriented x 3 PSYCHIATRIC:  Normal affect   ASSESSMENT:    No diagnosis found.  PLAN:    In order of problems listed above:  #CAD: Cath 09/2020 with diffuse distal disease not amenable for PCI. Now on medical management. -Continue ASA  '81mg'$  daily -Continue crestor '40mg'$  daily -Continue metop '25mg'$  XL -Continue losartan '25mg'$  daily  #HLD: -Continue crestor '40mg'$  daily  #HTN: -Continue metop '25mg'$  XL -Continue losartan '25mg'$  daily   Medication Adjustments/Labs and Tests Ordered: Current medicines are reviewed at length  with the patient today.  Concerns regarding medicines are outlined above.  No orders of the defined types were placed in this encounter.  No orders of the defined types were placed in this encounter.   There are no Patient Instructions on file for this visit.    Signed, Freada Bergeron, MD  09/05/2022 4:48 PM    [place]

## 2022-09-05 ENCOUNTER — Encounter: Payer: Self-pay | Admitting: Cardiology

## 2022-09-05 ENCOUNTER — Ambulatory Visit: Payer: PPO | Attending: Cardiology | Admitting: Cardiology

## 2022-09-05 VITALS — BP 128/75 | HR 57 | Ht 72.0 in | Wt 236.0 lb

## 2022-09-05 DIAGNOSIS — E7800 Pure hypercholesterolemia, unspecified: Secondary | ICD-10-CM | POA: Diagnosis not present

## 2022-09-05 DIAGNOSIS — I10 Essential (primary) hypertension: Secondary | ICD-10-CM | POA: Diagnosis not present

## 2022-09-05 DIAGNOSIS — I2510 Atherosclerotic heart disease of native coronary artery without angina pectoris: Secondary | ICD-10-CM

## 2022-09-05 DIAGNOSIS — Z79899 Other long term (current) drug therapy: Secondary | ICD-10-CM

## 2022-09-05 MED ORDER — ROSUVASTATIN CALCIUM 40 MG PO TABS: 40.0000 mg | ORAL_TABLET | Freq: Every day | ORAL | 2 refills | Status: DC

## 2022-09-05 MED ORDER — NITROGLYCERIN 0.4 MG SL SUBL: 0.4000 mg | SUBLINGUAL_TABLET | SUBLINGUAL | 3 refills | Status: DC | PRN

## 2022-09-05 NOTE — Progress Notes (Signed)
Cardiology Office Note:    Date:  09/05/2022   ID:  KHANH TANORI, DOB 1955-12-08, MRN [id number]  PCP:  Nicolette Bang, MD  [facility] Cardiologist:  Freada Bergeron, MD  [facility] Electrophysiologist:  None   Referring MD: Caryl Never*     History of Present Illness:    Andrew Fuentes is a 66 y.o. male with history of CAD, HTN, and HLD who presents to clinic for follow-up.  Was initially seen in 09/2020 for chest pain. Symptoms were concerning for stable angina.  Follow-up cardiac catheterization by Dr. Irish Lack 10/10/2020 showed diffuse, distal vessel and branch vessel disease. Areas of stenosis are relatively small and not good candidates for PCI.  Recommended medical therapy.  If refractory angina could consider PCI of first diagonal CTO. Echo 10/2020 showed LVEF of 60-65% and grade 1 DD.   Was last seen in clinic by [facility] as a pre-op visit. Was stable from a CV standpoint at that time. 37  Today the patient is accompanied by his 68 year old granddaughter. He states that he is overall doing well. He has recovered from his back injury and has undergone treatment for his prostate cancer. He has been stable from a CV standpoint with no chest pain, SOB, LE edema, orthopnea or PND.  Has been tolerating his medications as prescribed. States he does not monitor his BP at home but was better controlled at his oncology visit.   He denies any palpitations, or shortness of breath. No lightheadedness, headaches, syncope, orthopnea, or PND.   Past Medical History:  Diagnosis Date   Cancer ([place])    GERD (gastroesophageal reflux disease)    Hyperlipidemia    Hypertension    MRSA (methicillin resistant staph aureus) culture positive 2019    Past Surgical History:  Procedure Laterality Date   COLONOSCOPY  2011   [id number], TA x1   LEFT HEART CATH AND CORONARY ANGIOGRAPHY N/A 10/10/2020   Procedure: LEFT HEART CATH AND CORONARY ANGIOGRAPHY;   Surgeon: Jettie Booze, MD;  Location: [place] [facility];  Service: Cardiovascular;  Laterality: N/A;   POLYPECTOMY  2011   TA   PROSTATE BIOPSY  07/21/2021   RADIOACTIVE SEED IMPLANT N/A 10/10/2021   Procedure: RADIOACTIVE SEED IMPLANT/BRACHYTHERAPY IMPLANT;  Surgeon: Abbie Sons, MD;  Location: [facility] ORS;  Service: Urology;  Laterality: N/A;    Current Medications: Current Meds  Medication Sig   aspirin EC 81 MG tablet Take 1 tablet (81 mg total) by mouth daily. Swallow whole.   Calcium Carb-Cholecalciferol (CALCIUM 1000 + D PO) Take 1 tablet by mouth daily. (Plant Based)   ibuprofen (ADVIL) 200 MG tablet Take 200 mg by mouth every 6 (six) hours as needed (pain.).   losartan (COZAAR) 25 MG tablet Take 1 tablet (25 mg total) by mouth daily.   metoprolol succinate (TOPROL XL) 25 MG 24 hr tablet Take 1 tablet (25 mg total) by mouth daily.   Multiple Vitamin (MULTIVITAMIN WITH MINERALS) TABS tablet Take 1 tablet by mouth every evening. Centum Multivitamin   nitroGLYCERIN (NITROSTAT) 0.4 MG SL tablet Place 1 tablet (0.4 mg total) under the tongue every 5 (five) minutes as needed for chest pain.   Probiotic Product (PROBIOTIC ADVANCED PO) Take 1 capsule by mouth in the morning.   tamsulosin (FLOMAX) 0.4 MG CAPS capsule Take 1 capsule (0.4 mg total) by mouth daily after supper.   TURMERIC PO Take 1 capsule by mouth in the morning.  Zinc 50 MG TABS Take 50 mg by mouth in the morning.     Allergies:   Wound dressing adhesive   Social History   Socioeconomic History   Marital status: Married    Spouse name: Not on file   Number of children: Not on file   Years of education: Not on file   Highest education level: Not on file  Occupational History   Not on file  Tobacco Use   Smoking status: Never   Smokeless tobacco: Never  Vaping Use   Vaping Use: Never used  Substance and Sexual Activity   Alcohol use: Yes    Comment: less than monthly   Drug use: Never   Sexual  activity: Yes    Birth control/protection: None  Other Topics Concern   Not on file  Social History Narrative   Not on file   Social Determinants of Health   Financial Resource Strain: Not on file  Food Insecurity: Not on file  Transportation Needs: Not on file  Physical Activity: Not on file  Stress: Not on file  Social Connections: Not on file     Family History: Family History  Problem Relation Age of Onset   Heart attack Father 7   Colon cancer Neg Hx    Colon polyps Neg Hx    Esophageal cancer Neg Hx    Rectal cancer Neg Hx    Stomach cancer Neg Hx     ROS:   Please see the history of present illness.    Review of Systems  Constitutional:  Negative for chills and fever.  Eyes:  Negative for blurred vision and pain.  Respiratory:  Negative for cough, hemoptysis and shortness of breath.   Cardiovascular:  Negative for chest pain, palpitations, orthopnea, claudication, leg swelling (mild) and PND.  Gastrointestinal:  Negative for blood in stool, diarrhea, nausea and vomiting.  Genitourinary:  Negative for dysuria and hematuria.  Musculoskeletal:  Negative for falls.  Neurological:  Negative for dizziness, loss of consciousness and headaches.    EKGs/Labs/Other Studies Reviewed:    The following studies were reviewed today:  Echo 10/2020 IMPRESSIONS   1. Left ventricular ejection fraction, by estimation, is 60 to 65%. The  left ventricle has normal function. The left ventricle has no regional  wall motion abnormalities. Left ventricular diastolic parameters are  consistent with Grade I diastolic  dysfunction (impaired relaxation).   2. Right ventricular systolic function is normal. The right ventricular  size is normal. Tricuspid regurgitation signal is inadequate for assessing  PA pressure.   3. Left atrial size was mild to moderately dilated.   4. The mitral valve is normal in structure. Trivial mitral valve  regurgitation.   5. The aortic valve is grossly  normal. Aortic valve regurgitation is not  visualized. No aortic stenosis is present.   Comparison(s): No prior Echocardiogram.   Conclusion(s)/Recommendation(s): Normal biventricular function without  evidence of hemodynamically significant valvular heart disease.    LEFT HEART CATH AND CORONARY ANGIOGRAPHY 10/10/2020:   RPDA lesion is 75% stenosed. RPAV lesion is 80% stenosed. 1st Diag-1 lesion is 75% stenosed. 1st Diag-2 lesion is 100% stenosed. 3rd Mrg lesion is 70% stenosed. Dist LAD lesion is 90% stenosed. The left ventricular systolic function is normal. LV end diastolic pressure is normal. The left ventricular ejection fraction is 55-65% by visual estimate. There is no aortic valve stenosis.   Diffuse, distal vessel and branch vessel disease.  Areas of stenosis are relatively small and not good  candidates for PCI.    Plan for medical therapy.  If he had refractory angina, could consider PCI of first diagonal CTO.     Due to difficulty engaging RCA, if repeat cath was done, would use right groin approach.     Will keep him out of work for a week since the end of the week is a holiday.   Diagnostic Dominance: Right      CTA with abdomen/pelvis 12/2009: Findings:  Intact abdominal aorta.  Mild tortuosity without  significant atherosclerosis.  Negative for aneurysm, dissection or  occlusive disease.  Celiac, SMA, and IMA are all patent off of the  aorta.  No mesenteric occlusive vascular disease centrally.  Single  renal arteries are identified and widely patent.  No proximal or  ostial renal artery stenosis.  No evidence of mesenteric vascular  aneurysm by CTA.  Splenic artery is noted to be tortuous as it  courses posterior to the proximal stomach which may account for the  endoscopic findings.  Otherwise, the stomach wall appears uniform.  No vascular abnormality demonstrated within the wall of stomach.  Stomach is under distended.  No significant hernia.      Additional axial imaging:  Minimal hypoventilatory changes of the  lower lobes.  Normal heart size.  No pericardial or pleural  effusion.     Calcified splenic granulomata are noted.  Liver, gallbladder,  biliary system, adrenal glands, and pancreas within normal limits  for age.  Right kidney demonstrates cortical low density cystic  lesions consistent with renal cysts, largest measures 2 cm in the  lower pole.  The left kidney demonstrates a nonobstructing midpole  7 mm calculus, image 49.  Normal symmetric renal enhancement and  excretion.  No bowel obstruction pattern, dilatation, or ileus.  Retained stool throughout the colon.  No abdominal acute  inflammation, fluid collection, free fluid, abscess, or hemorrhage.  Bilateral L4 pars defects are noted with minimal anterior listhesis  and moderately severe degenerative change at this level as well.      Review of the MIP images confirms the above findings.     IMPRESSION:  No evidence of intra abdominal mesenteric aneurysm.     Prominent tortuous splenic artery courses posterior to the stomach  and may account for the described endoscopic findings.     No evidence of mesenteric or renal vascular disease.     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   EKG:  EKG is personally reviewed. 09/05/2022: Sinus bradycardia, LVH . Rate 57 bpm. 10/03/2020: NSR with LVH. HR 61.  Recent Labs: 10/03/2021: BUN 23; Creatinine, Ser 0.88; Hemoglobin 13.1; Platelets 200; Potassium 4.0; Sodium 136  Recent Lipid Panel    Component Value Date/Time   CHOL 137 07/07/2021 0738   TRIG 131 07/07/2021 0738   HDL 45 07/07/2021 0738   CHOLHDL 3.0 07/07/2021 0738   CHOLHDL 5 11/22/2009 1539   VLDL 18.4 11/22/2009 1539   LDLCALC 69 07/07/2021 0738   LDLDIRECT 182.4 11/22/2009 1539      Physical Exam:     VS:  BP 128/75 (BP Location: Right Arm, Patient Position: Sitting, Cuff Size: Large)   Pulse (!) 57   Ht 6' (1.829 m)   Wt 236 lb (107 kg)   SpO2 95%   BMI 32.01 kg/m     Wt Readings from Last 3 Encounters:  09/05/22  236 lb (107 kg)  08/27/22 238 lb (108 kg)  05/23/22 239 lb (108.4 kg)     GEN:  Well nourished, well developed in no acute distress HEENT: Normal NECK: No JVD; No carotid bruits CARDIAC: RRR, no rubs or gallops RESPIRATORY:  Clear to auscultation without rales, wheezing or rhonchi  ABDOMEN: Soft, non-tender, non-distended MUSCULOSKELETAL:  No edema; No deformity  SKIN: Warm and dry NEUROLOGIC:  Alert and oriented x 3 PSYCHIATRIC:  Normal affect   ASSESSMENT:    1. Coronary artery disease involving native coronary artery of native heart without angina pectoris   2. Pure hypercholesterolemia   3. Primary hypertension   4. Medication management     PLAN:    In order of problems listed above:  #CAD: Cath 09/2020 with diffuse distal disease not amenable for PCI. Now on medical management and currently doing well without anginal symptoms.  -Continue ASA '81mg'$  daily -Resume crestor '40mg'$  daily -Continue metop '25mg'$  XL -Continue losartan '25mg'$  daily  #HLD: -Resume crestor '40mg'$  daily -Repeat lipids in 8 weeks once back on crestor -Goal LDL<70  #HTN: Will start monitoring at home. Was better controlled on re-check. -Continue metop '25mg'$  XL -Continue losartan '25mg'$  daily  Follow up: 6 months  Medication Adjustments/Labs and Tests Ordered: Current medicines are reviewed at length with the patient today.  Concerns regarding medicines are outlined above.   Orders Placed This Encounter  Procedures   Lipid Profile   EKG 12-Lead   Meds ordered this encounter  Medications   rosuvastatin (CRESTOR) 40 MG tablet    Sig: Take 1 tablet (40 mg total) by mouth daily.    Dispense:  90 tablet    Refill:  2   nitroGLYCERIN (NITROSTAT) 0.4 MG SL tablet    Sig:  Place 1 tablet (0.4 mg total) under the tongue every 5 (five) minutes as needed for chest pain.    Dispense:  90 tablet    Refill:  3   Patient Instructions  Medication Instructions:   DR. Johney Frame HAS PRESCRIBED YOU SUBLINGUAL (UNDER THE TONGUE) NITROGLYCERIN 0.4 MG--PLACE ONE TABLET UNDER THE TONGUE EVERY 5 MINS AS NEEDED FOR CHEST PAIN--IF YOU ARE REACHING FOR TABLET # 3 WITH NO RELIEF, PLEASE CALL 911  *If you need a refill on your cardiac medications before your next appointment, please call your pharmacy*   Lab Work:  IN [place] OFFICE--CHECK LIPIDS--PLEASE COME FASTING TO THIS LAB APPOINTMENT  If you have labs (blood work) drawn today and your tests are completely normal, you will receive your results only by: [place] (if you have MyChart) OR A paper copy in the mail If you have any lab test that is abnormal or we need to change your treatment, we will call you to review the results.   Follow-Up: At [facility], you and your health needs are our priority.  As part of our continuing mission to provide you with exceptional heart care, we have created designated Provider Care Teams.  These Care Teams include your primary Cardiologist (physician) and Advanced Practice Providers (APPs -  Physician Assistants and Nurse Practitioners) who all work together to provide you with the care you need, when you need it.  We recommend signing up for the patient portal called "MyChart".  Sign up information is provided on this After Visit Summary.  MyChart is used to connect with patients for Virtual Visits (Telemedicine).  Patients are able to view lab/test results, encounter notes, upcoming appointments, etc.  Non-urgent messages can be sent to your provider as well.   To learn more about what you can do with MyChart, go to [email or web address].    Your next appointment:   6 month(s)  The format for your next appointment:   In Person  Provider:   Freada Bergeron, MD      Important Information About [place] as a scribe for Freada Bergeron, MD.,have documented all relevant documentation on the behalf of Freada Bergeron, MD,as directed by  Freada Bergeron, MD while in the presence of Freada Bergeron, MD.   I, Freada Bergeron, MD, have reviewed all documentation for this visit. The documentation on 09/05/22 for the exam, diagnosis, procedures, and orders are all accurate and complete.   Signed, Freada Bergeron, MD  09/05/2022 8:11 PM    [place]

## 2022-09-05 NOTE — Patient Instructions (Signed)
Medication Instructions:   DR. Johney Frame HAS PRESCRIBED YOU SUBLINGUAL (UNDER THE TONGUE) NITROGLYCERIN 0.4 MG--PLACE ONE TABLET UNDER THE TONGUE EVERY 5 MINS AS NEEDED FOR CHEST PAIN--IF YOU ARE REACHING FOR TABLET # 3 WITH NO RELIEF, PLEASE CALL 911  *If you need a refill on your cardiac medications before your next appointment, please call your pharmacy*   Lab Work:  IN [place] OFFICE--CHECK LIPIDS--PLEASE COME FASTING TO THIS LAB APPOINTMENT  If you have labs (blood work) drawn today and your tests are completely normal, you will receive your results only by: [place] (if you have MyChart) OR A paper copy in the mail If you have any lab test that is abnormal or we need to change your treatment, we will call you to review the results.   Follow-Up: At [facility], you and your health needs are our priority.  As part of our continuing mission to provide you with exceptional heart care, we have created designated Provider Care Teams.  These Care Teams include your primary Cardiologist (physician) and Advanced Practice Providers (APPs -  Physician Assistants and Nurse Practitioners) who all work together to provide you with the care you need, when you need it.  We recommend signing up for the patient portal called "MyChart".  Sign up information is provided on this After Visit Summary.  MyChart is used to connect with patients for Virtual Visits (Telemedicine).  Patients are able to view lab/test results, encounter notes, upcoming appointments, etc.  Non-urgent messages can be sent to your provider as well.   To learn more about what you can do with MyChart, go to [email or web address].    Your next appointment:   6 month(s)  The format for your next appointment:   In Person  Provider:   Freada Bergeron, MD      Important Information About [place]

## 2022-11-02 ENCOUNTER — Ambulatory Visit: Payer: PPO | Attending: Cardiology

## 2022-11-02 DIAGNOSIS — E7800 Pure hypercholesterolemia, unspecified: Secondary | ICD-10-CM | POA: Diagnosis not present

## 2022-11-02 DIAGNOSIS — I2510 Atherosclerotic heart disease of native coronary artery without angina pectoris: Secondary | ICD-10-CM

## 2022-11-02 DIAGNOSIS — Z79899 Other long term (current) drug therapy: Secondary | ICD-10-CM | POA: Diagnosis not present

## 2022-11-02 DIAGNOSIS — I10 Essential (primary) hypertension: Secondary | ICD-10-CM | POA: Diagnosis not present

## 2022-11-02 LAB — LIPID PANEL
Chol/HDL Ratio: 2.7 ratio (ref 0.0–5.0)
Cholesterol, Total: 153 mg/dL (ref 100–199)
HDL: 57 mg/dL (ref >39)
LDL Chol Calc (NIH): 81 mg/dL (ref 0–99)
Triglycerides: 78 mg/dL (ref 0–149)
VLDL Cholesterol Cal: 15 mg/dL (ref 5–40)

## 2022-11-06 ENCOUNTER — Telehealth: Payer: Self-pay | Admitting: Cardiology

## 2022-11-06 DIAGNOSIS — E7800 Pure hypercholesterolemia, unspecified: Secondary | ICD-10-CM

## 2022-11-06 DIAGNOSIS — Z79899 Other long term (current) drug therapy: Secondary | ICD-10-CM

## 2022-11-06 DIAGNOSIS — I2510 Atherosclerotic heart disease of native coronary artery without angina pectoris: Secondary | ICD-10-CM

## 2022-11-06 MED ORDER — EZETIMIBE 10 MG PO TABS: 10.0000 mg | ORAL_TABLET | Freq: Every day | ORAL | 3 refills | Status: DC

## 2022-11-06 NOTE — Telephone Encounter (Signed)
-----  Message from Freada Bergeron, MD sent at 11/05/2022  3:41 PM EST ----- Cholesterol is above goal. Is he tolerating the crestor '40mg'$  daily? If so, can we add zetia '10mg'$  daily and repeat lipids in 8 weeks to ensure he is at goal.

## 2022-11-06 NOTE — Telephone Encounter (Signed)
The patient has been notified of the result and verbalized understanding.  All questions (if any) were answered.  Pt states he is tolerating his crestor very well and takes this everyday.  He is aware that we will add zetia 10 mg po daily to his regimen and have him come back into the office for repeat lipids in 8 weeks.  Confirmed the pharmacy of choice with the pt.   Scheduled the pt for repeat lipids in 8 weeks on 01/01/23.  He is aware to come fasting to this lab appt.   Pt verbalized understanding and agrees with this plan.

## 2022-11-06 NOTE — Telephone Encounter (Signed)
Patient is returning call to discuss lab results. 

## 2022-11-20 ENCOUNTER — Other Ambulatory Visit: Payer: PPO

## 2022-11-20 ENCOUNTER — Other Ambulatory Visit: Payer: Self-pay | Admitting: Radiation Oncology

## 2022-11-20 DIAGNOSIS — C61 Malignant neoplasm of prostate: Secondary | ICD-10-CM | POA: Diagnosis not present

## 2022-11-22 LAB — PSA: Prostate Specific Ag, Serum: <0.1 ng/mL (ref 0.0–4.0)

## 2022-11-22 NOTE — Progress Notes (Unsigned)
11/23/2022 6:29 PM   Andrew Fuentes [facility] [birth year] [id number]  Referring provider: Nicolette Bang, MD No address on file  Urological history 1. Prostate cancer -PSA (10/2022) - <0.1 -Intermediate risk (unfavorable) prostate cancer -6 months ADT given 08/2021 -I-125 interstitial implant 10/10/2021  2. BPH with LU TS -prostate volume 35 cc on prostate ultrasound during prostate biopsy  - I PSS 14/2  3. High risk hematuria -Non-smoker -cysto, 02/2022 - NED -no reports of gross heme -UA, 02/2022- negative for micro heme  HPI: Andrew Fuentes is a 67 y.o. male who presents today for a six month follow up.  He is having issues with maintaining erections.  Patient is not having spontaneous erections.  He denies any pain or curvature with erections.    He continues to have episodes of urgency, dysuria and burning for a few minutes after urination.  He states the symptoms are intermittent in nature.  Sometimes they can last a day and sometimes they can last a week.  He does have the Uribel on hand, but he has not tried this medication to see if it provides relief.  He cannot provide a urine specimen today, but he does not feel it is due to UTI because it has been occurring for so long and intermittent in nature.  Patient denies any modifying or aggravating factors.  Patient denies any gross hematuria, dysuria or suprapubic/flank pain.  Patient denies any fevers, chills, nausea or vomiting.       IPSS     Row Name 11/23/22 1000         International Prostate Symptom Score   How often have you had the sensation of not emptying your bladder? Not at All     How often have you had to urinate less than every two hours? About half the time     How often have you found you stopped and started again several times when you urinated? Less than 1 in 5 times     How often have you found it difficult to postpone urination? Less than half the time     How often have you had  a weak urinary stream? More than half the time     How often have you had to strain to start urination? Less than 1 in 5 times     How many times did you typically get up at night to urinate? 3 Times     Total IPSS Score 14       Quality of Life due to urinary symptoms   If you were to spend the rest of your life with your urinary condition just the way it is now how would you feel about that? Mostly Satisfied               Score:  1-7 Mild 8-19 Moderate 20-35 Severe   PMH: Past Medical History:  Diagnosis Date   Cancer ([place])    GERD (gastroesophageal reflux disease)    Hyperlipidemia    Hypertension    MRSA (methicillin resistant staph aureus) culture positive 2019    Surgical History: Past Surgical History:  Procedure Laterality Date   COLONOSCOPY  2011   [id number], TA x1   LEFT HEART CATH AND CORONARY ANGIOGRAPHY N/A 10/10/2020   Procedure: LEFT HEART CATH AND CORONARY ANGIOGRAPHY;  Surgeon: Jettie Booze, MD;  Location: [place] CV LAB;  Service: Cardiovascular;  Laterality: N/A;   POLYPECTOMY  2011   TA   PROSTATE BIOPSY  07/21/2021   RADIOACTIVE SEED IMPLANT N/A 10/10/2021   Procedure: RADIOACTIVE SEED IMPLANT/BRACHYTHERAPY IMPLANT;  Surgeon: Abbie Sons, MD;  Location: [facility] ORS;  Service: Urology;  Laterality: N/A;    Home Medications:  Allergies as of 11/23/2022       Reactions   Wound Dressing Adhesive Itching        Medication List        Accurate as of November 23, 2022 11:59 PM. If you have any questions, ask your nurse or doctor.          aspirin EC 81 MG tablet Take 1 tablet (81 mg total) by mouth daily. Swallow whole.   CALCIUM 1000 + D PO Take 1 tablet by mouth daily. (Plant Based)   ezetimibe 10 MG tablet Commonly known as: ZETIA Take 1 tablet (10 mg total) by mouth daily.   Gemtesa 75 MG Tabs Generic drug: Vibegron Take 1 tablet (75 mg total) by mouth daily. Started by: Zara Council, PA-C   ibuprofen 200 MG  tablet Commonly known as: ADVIL Take 200 mg by mouth every 6 (six) hours as needed (pain.).   losartan 25 MG tablet Commonly known as: COZAAR Take 1 tablet (25 mg total) by mouth daily.   metoprolol succinate 25 MG 24 hr tablet Commonly known as: Toprol XL Take 1 tablet (25 mg total) by mouth daily.   multivitamin with minerals Tabs tablet Take 1 tablet by mouth every evening. Centum Multivitamin   nitroGLYCERIN 0.4 MG SL tablet Commonly known as: NITROSTAT Place 1 tablet (0.4 mg total) under the tongue every 5 (five) minutes as needed for chest pain.   PROBIOTIC ADVANCED PO Take 1 capsule by mouth in the morning.   rosuvastatin 40 MG tablet Commonly known as: CRESTOR Take 1 tablet (40 mg total) by mouth daily.   tadalafil 20 MG tablet Commonly known as: CIALIS Take 1 tablet (20 mg total) by mouth daily as needed for erectile dysfunction. Started by: Zara Council, PA-C   tamsulosin 0.4 MG Caps capsule Commonly known as: FLOMAX TAKE 1 CAPSULE BY MOUTH EVERY DAY AFTER SUPPER   TURMERIC PO Take 1 capsule by mouth in the morning.   Zinc 50 MG Tabs Take 50 mg by mouth in the morning.        Allergies:  Allergies  Allergen Reactions   Wound Dressing Adhesive Itching    Family History: Family History  Problem Relation Age of Onset   Heart attack Father 3   Colon cancer Neg Hx    Colon polyps Neg Hx    Esophageal cancer Neg Hx    Rectal cancer Neg Hx    Stomach cancer Neg Hx     Social History:  reports that he has never smoked. He has never used smokeless tobacco. He reports current alcohol use. He reports that he does not use drugs.  ROS: Pertinent ROS in HPI  Physical Exam: BP 130/78   Pulse 60   Ht 6' (1.829 m)   Wt 229 lb (103.9 kg)   BMI 31.06 kg/m   Constitutional:  Well nourished. Alert and oriented, No acute distress. HEENT: [place] AT, moist mucus membranes.  Trachea midline Cardiovascular: No clubbing, cyanosis, or edema. Respiratory:  Normal respiratory effort, no increased work of breathing. Neurologic: Grossly intact, no focal deficits, moving all 4 extremities. Psychiatric: Normal mood and affect.   Laboratory Data: Component     Latest Ref Rng 11/20/2022  Prostate Specific Ag, Serum     0.0 -  4.0 ng/mL <0.1   I have reviewed the labs.   Pertinent Imaging: N/A  Assessment & Plan:    1. Prostate cancer -PSA undetectable  2. High risk hematuria -non-smoker -cysto, 02/2022 - NED  3. ED -He has not tried PDE 5 inhibitors in the past -I will prescribe a prescription of tadalafil 20 mg, on-demand dosing -I advised him of the possible fatal reaction between nitroglycerin tablets and PDE5 inhibitors and to not take the 2 medications in combination  4.  Urgency -Recommended that he take the Uribel tablets and I also gave him Gemtesa tablets to take during those times as well -Will reassess when he returns in 6 months.  Return in about 6 months (around 05/24/2023) for IPSS, SHIM, PSA and exam.  These notes generated with voice recognition software. I apologize for typographical errors.  [place], [place] [address]  [place], [place] [address] [phone]

## 2022-11-23 ENCOUNTER — Ambulatory Visit: Payer: PPO | Admitting: Urology

## 2022-11-23 ENCOUNTER — Encounter: Payer: Self-pay | Admitting: Urology

## 2022-11-23 VITALS — BP 130/78 | HR 60 | Ht 72.0 in | Wt 229.0 lb

## 2022-11-23 DIAGNOSIS — C61 Malignant neoplasm of prostate: Secondary | ICD-10-CM | POA: Diagnosis not present

## 2022-11-23 DIAGNOSIS — R232 Flushing: Secondary | ICD-10-CM | POA: Diagnosis not present

## 2022-11-23 DIAGNOSIS — R319 Hematuria, unspecified: Secondary | ICD-10-CM

## 2022-11-23 DIAGNOSIS — N5235 Erectile dysfunction following radiation therapy: Secondary | ICD-10-CM

## 2022-11-23 MED ORDER — TADALAFIL 20 MG PO TABS
20.0000 mg | ORAL_TABLET | Freq: Every day | ORAL | 3 refills | Status: DC | PRN
Start: 2022-11-23 — End: 2024-06-17

## 2022-11-23 MED ORDER — GEMTESA 75 MG PO TABS: 75.0000 mg | ORAL_TABLET | Freq: Every day | ORAL | 0 refills | Status: AC

## 2023-01-01 ENCOUNTER — Ambulatory Visit: Payer: PPO | Attending: Cardiology

## 2023-01-01 DIAGNOSIS — I2510 Atherosclerotic heart disease of native coronary artery without angina pectoris: Secondary | ICD-10-CM

## 2023-01-01 DIAGNOSIS — E7800 Pure hypercholesterolemia, unspecified: Secondary | ICD-10-CM | POA: Diagnosis not present

## 2023-01-01 DIAGNOSIS — Z79899 Other long term (current) drug therapy: Secondary | ICD-10-CM

## 2023-01-01 LAB — LIPID PANEL
Chol/HDL Ratio: 2 ratio (ref 0.0–5.0)
Cholesterol, Total: 93 mg/dL — ABNORMAL LOW (ref 100–199)
HDL: 47 mg/dL (ref >39)
LDL Chol Calc (NIH): 33 mg/dL (ref 0–99)
Triglycerides: 51 mg/dL (ref 0–149)
VLDL Cholesterol Cal: 13 mg/dL (ref 5–40)

## 2023-02-20 ENCOUNTER — Inpatient Hospital Stay: Payer: PPO

## 2023-02-21 ENCOUNTER — Inpatient Hospital Stay: Payer: PPO | Attending: Radiation Oncology

## 2023-02-21 DIAGNOSIS — C61 Malignant neoplasm of prostate: Secondary | ICD-10-CM | POA: Diagnosis not present

## 2023-02-21 LAB — PSA: Prostatic Specific Antigen: 0.06 ng/mL (ref 0.00–4.00)

## 2023-02-27 ENCOUNTER — Ambulatory Visit
Admission: RE | Admit: 2023-02-27 | Discharge: 2023-02-27 | Disposition: A | Discharge status: Home / Self Care | Payer: PPO | Source: Ambulatory Visit | Attending: Radiation Oncology | Admitting: Radiation Oncology

## 2023-02-27 VITALS — BP 147/92 | HR 58 | Temp 98.0°F | Resp 16 | Ht 72.0 in | Wt 239.5 lb

## 2023-02-27 DIAGNOSIS — Z923 Personal history of irradiation: Secondary | ICD-10-CM | POA: Insufficient documentation

## 2023-02-27 DIAGNOSIS — C61 Malignant neoplasm of prostate: Secondary | ICD-10-CM | POA: Diagnosis not present

## 2023-02-27 DIAGNOSIS — Z191 Hormone sensitive malignancy status: Secondary | ICD-10-CM | POA: Diagnosis not present

## 2023-02-27 NOTE — Progress Notes (Signed)
Radiation Oncology Follow up Note  Name: Andrew Fuentes   Date:   02/27/2023 MRN:  [id number] DOB: Jan 18, 1956    This 67 y.o. 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  REFERRING PROVIDER: Leary Roca*  HPI: Patient is a 67 year old male now out 60 months having completed I-125 interstitial implant for Gleason 7 adenocarcinoma the prostate.  Seen today in routine follow-up he is doing well.  He specifically denies any increased lower urinary tract symptoms diarrhea or fatigue.  His most recent PSA is 0.06 incremental increase from November when it was 0.03.Marland Kitchen  COMPLICATIONS OF TREATMENT: none  FOLLOW UP COMPLIANCE: keeps appointments   PHYSICAL EXAM:  BP (!) 147/92   Pulse (!) 58   Temp 98 F (36.7 C)   Resp 16   Ht 6' (1.829 m)   Wt 239 lb 8 oz (108.6 kg)   BMI 32.48 kg/m  Well-developed well-nourished patient in NAD. HEENT reveals PERLA, EOMI, discs not visualized.  Oral cavity is clear. No oral mucosal lesions are identified. Neck is clear without evidence of cervical or supraclavicular adenopathy. Lungs are clear to A&P. Cardiac examination is essentially unremarkable with regular rate and rhythm without murmur rub or thrill. Abdomen is benign with no organomegaly or masses noted. Motor sensory and DTR levels are equal and symmetric in the upper and lower extremities. Cranial nerves II through XII are grossly intact. Proprioception is intact. No peripheral adenopathy or edema is identified. No motor or sensory levels are noted. Crude visual fields are within normal range.  RADIOLOGY RESULTS: No current films for review  PLAN: Present time patient is doing well under excellent biochemical control of his prostate cancer.  And pleased with his overall progress.  I have asked to see him back in 1 year.  He is seeing urology on 31-month intervals.  Patient is to call with  any concerns at any time.  I would like to take this opportunity to thank you for allowing me to participate in the care of your patient.Carmina Miller, MD

## 2023-03-04 NOTE — Progress Notes (Unsigned)
Cardiology Office Note:    Date:  03/07/2023   ID:  DEARRIUS Fuentes, DOB October 22, 1956, MRN [id number]  PCP:  Andrew Market, MD (Inactive)  [facility] Cardiologist:  Meriam Sprague, MD  [facility] Electrophysiologist:  None   Referring MD: Andrew Fuentes*     History of Present Illness:    Andrew Fuentes is a 67 y.o. male with history of CAD, HTN, and HLD who presents to clinic for follow-up.  Was initially seen in 09/2020 for chest pain. Symptoms were concerning for stable angina.  Follow-up cardiac catheterization by Dr. Eldridge Dace 10/10/2020 showed diffuse, distal vessel and branch vessel disease. Areas of stenosis are relatively small and not good candidates for PCI.  Recommended medical therapy.  If refractory angina could consider PCI of first diagonal CTO. Echo 10/2020 showed LVEF of 60-65% and grade 1 DD.   Was last seen in clinic on 08/2022 where he was doing well from a CV standpoint.   Today, the patient feels well. No chest pain, SOB, orthopnea or PND. Walking daily and feels well with activity. No LE edema, lightheadedness or dizziness. Tolerating medications as prescribed. Does not monitor BP at home.    Past Medical History:  Diagnosis Date   Cancer (HCC)    GERD (gastroesophageal reflux disease)    Hyperlipidemia    Hypertension    MRSA (methicillin resistant staph aureus) culture positive 2019    Past Surgical History:  Procedure Laterality Date   COLONOSCOPY  2011   [id number], TA x1   LEFT HEART CATH AND CORONARY ANGIOGRAPHY N/A 10/10/2020   Procedure: LEFT HEART CATH AND CORONARY ANGIOGRAPHY;  Surgeon: Andrew Crafts, MD;  Location: [facility] INVASIVE CV LAB;  Service: Cardiovascular;  Laterality: N/A;   POLYPECTOMY  2011   TA   PROSTATE BIOPSY  07/21/2021   RADIOACTIVE SEED IMPLANT N/A 10/10/2021   Procedure: RADIOACTIVE SEED IMPLANT/BRACHYTHERAPY IMPLANT;  Surgeon: Andrew Altes, MD;  Location: [facility] ORS;  Service:  Urology;  Laterality: N/A;    Current Medications: Current Meds  Medication Sig   aspirin EC 81 MG tablet Take 1 tablet (81 mg total) by mouth daily. Swallow whole.   Calcium Carb-Cholecalciferol (CALCIUM 1000 + D PO) Take 1 tablet by mouth daily. (Plant Based)   ezetimibe (ZETIA) 10 MG tablet Take 1 tablet (10 mg total) by mouth daily.   ibuprofen (ADVIL) 200 MG tablet Take 200 mg by mouth every 6 (six) hours as needed (pain.).   losartan (COZAAR) 25 MG tablet Take 1 tablet (25 mg total) by mouth daily.   metoprolol succinate (TOPROL XL) 25 MG 24 hr tablet Take 1 tablet (25 mg total) by mouth daily.   Multiple Vitamin (MULTIVITAMIN WITH MINERALS) TABS tablet Take 1 tablet by mouth every evening. Centum Multivitamin   Probiotic Product (PROBIOTIC ADVANCED PO) Take 1 capsule by mouth in the morning.   rosuvastatin (CRESTOR) 40 MG tablet Take 1 tablet (40 mg total) by mouth daily.   tadalafil (CIALIS) 20 MG tablet Take 1 tablet (20 mg total) by mouth daily as needed for erectile dysfunction.   tamsulosin (FLOMAX) 0.4 MG CAPS capsule TAKE 1 CAPSULE BY MOUTH EVERY DAY AFTER SUPPER   TURMERIC PO Take 1 capsule by mouth in the morning.   Vibegron (GEMTESA) 75 MG TABS Take 1 tablet (75 mg total) by mouth daily.   Zinc 50 MG TABS Take 50 mg by mouth in the morning.     Allergies:   Wound dressing  adhesive   Social History   Socioeconomic History   Marital status: Married    Spouse name: Not on file   Number of children: Not on file   Years of education: Not on file   Highest education level: Not on file  Occupational History   Not on file  Tobacco Use   Smoking status: Never   Smokeless tobacco: Never  Vaping Use   Vaping Use: Never used  Substance and Sexual Activity   Alcohol use: Yes    Comment: less than monthly   Drug use: Never   Sexual activity: Yes    Birth control/protection: None  Other Topics Concern   Not on file  Social History Narrative   Not on file   Social  Determinants of Health   Financial Resource Strain: Not on file  Food Insecurity: Not on file  Transportation Needs: Not on file  Physical Activity: Not on file  Stress: Not on file  Social Connections: Not on file     Family History: Family History  Problem Relation Age of Onset   Heart attack Father 26   Colon cancer Neg Hx    Colon polyps Neg Hx    Esophageal cancer Neg Hx    Rectal cancer Neg Hx    Stomach cancer Neg Hx     ROS:   Please see the history of present illness.    As per HPI  EKGs/Labs/Other Studies Reviewed:    The following studies were reviewed today:  Echo 10/2020 IMPRESSIONS   1. Left ventricular ejection fraction, by estimation, is 60 to 65%. The  left ventricle has normal function. The left ventricle has no regional  wall motion abnormalities. Left ventricular diastolic parameters are  consistent with Grade I diastolic  dysfunction (impaired relaxation).   2. Right ventricular systolic function is normal. The right ventricular  size is normal. Tricuspid regurgitation signal is inadequate for assessing  PA pressure.   3. Left atrial size was mild to moderately dilated.   4. The mitral valve is normal in structure. Trivial mitral valve  regurgitation.   5. The aortic valve is grossly normal. Aortic valve regurgitation is not  visualized. No aortic stenosis is present.   Comparison(s): No prior Echocardiogram.   Conclusion(s)/Recommendation(s): Normal biventricular function without  evidence of hemodynamically significant valvular heart disease.    LEFT HEART CATH AND CORONARY ANGIOGRAPHY 10/10/2020:   RPDA lesion is 75% stenosed. RPAV lesion is 80% stenosed. 1st Diag-1 lesion is 75% stenosed. 1st Diag-2 lesion is 100% stenosed. 3rd Mrg lesion is 70% stenosed. Dist LAD lesion is 90% stenosed. The left ventricular systolic function is normal. LV end diastolic pressure is normal. The left ventricular ejection fraction is 55-65% by visual  estimate. There is no aortic valve stenosis.   Diffuse, distal vessel and branch vessel disease.  Areas of stenosis are relatively small and not good candidates for PCI.    Plan for medical therapy.  If he had refractory angina, could consider PCI of first diagonal CTO.     Due to difficulty engaging RCA, if repeat cath was done, would use right groin approach.     Will keep him out of work for a week since the end of the week is a holiday.   Diagnostic Dominance: Right      CTA with abdomen/pelvis 12/2009: Findings:  Intact abdominal aorta.  Mild tortuosity without  significant atherosclerosis.  Negative for aneurysm, dissection or  occlusive disease.  Celiac, SMA, and IMA  are all patent off of the  aorta.  No mesenteric occlusive vascular disease centrally.  Single  renal arteries are identified and widely patent.  No proximal or  ostial renal artery stenosis.  No evidence of mesenteric vascular  aneurysm by CTA.  Splenic artery is noted to be tortuous as it  courses posterior to the proximal stomach which may account for the  endoscopic findings.  Otherwise, the stomach wall appears uniform.  No vascular abnormality demonstrated within the wall of stomach.  Stomach is under distended.  No significant hernia.     Additional axial imaging:  Minimal hypoventilatory changes of the  lower lobes.  Normal heart size.  No pericardial or pleural  effusion.     Calcified splenic granulomata are noted.  Liver, gallbladder,  biliary system, adrenal glands, and pancreas within normal limits  for age.  Right kidney demonstrates cortical low density cystic  lesions consistent with renal cysts, largest measures 2 cm in the  lower pole.  The left kidney demonstrates a nonobstructing midpole  7 mm calculus, image 49.  Normal symmetric renal enhancement and  excretion.  No bowel obstruction pattern, dilatation, or ileus.  Retained stool throughout the colon.  No abdominal acute  inflammation,  fluid collection, free fluid, abscess, or hemorrhage.  Bilateral L4 pars defects are noted with minimal anterior listhesis  and moderately severe degenerative change at this level as well.      Review of the MIP images confirms the above findings.     IMPRESSION:  No evidence of intra abdominal mesenteric aneurysm.     Prominent tortuous splenic artery courses posterior to the stomach  and may account for the described endoscopic findings.     No evidence of mesenteric or renal vascular disease.     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   EKG:  EKG is personally reviewed.  Recent Labs: No results found for requested labs within last 365 days.  Recent Lipid Panel    Component Value Date/Time   CHOL 93 (L) 01/01/2023 0831   TRIG 51 01/01/2023 0831   HDL 47 01/01/2023 0831   CHOLHDL 2.0 01/01/2023 0831   CHOLHDL 5 11/22/2009 1539   VLDL 18.4 11/22/2009 1539   LDLCALC 33 01/01/2023 0831   LDLDIRECT 182.4 11/22/2009 1539      Physical Exam:    VS:  BP (!) 138/92   Pulse (!) 59   Ht 6' (1.829 m)   Wt 239 lb 6.4 oz (108.6 kg)   SpO2 95%   BMI 32.47 kg/m     Wt Readings from Last 3 Encounters:  03/07/23 239 lb 6.4 oz (108.6 kg)  02/27/23 239 lb 8 oz (108.6 kg)  11/23/22 229 lb (103.9 kg)     GEN:  Well nourished, well developed in no acute distress HEENT: Normal NECK: No JVD; No carotid bruits CARDIAC: RRR, no rubs or gallops RESPIRATORY:  CTAB, no wheezes ABDOMEN: Soft, non-tender, non-distended MUSCULOSKELETAL:  No edema; No deformity  SKIN: Warm and dry NEUROLOGIC:  Alert and oriented x 3 PSYCHIATRIC:  Normal affect   ASSESSMENT:    1. Coronary artery disease involving native coronary artery of native heart without angina pectoris   2. Pure hypercholesterolemia   3. Primary hypertension     PLAN:    In order of  problems listed above:  #CAD: Cath 09/2020 with  diffuse distal disease not amenable for PCI. Now on medical management and currently doing well without anginal symptoms.  -Continue ASA 81mg  daily -Continue crestor 40mg  daily -Continue metop 25mg  XL -Continue losartan 25mg  daily  #HLD: -Continue crestor 40mg  daily -LDL 33 on 12/2022 -Goal LDL<70  #HTN: -Does not monitor at home but will get BP cuff -Continue metop 25mg  XL -Continue losartan 25mg  daily  Follow up: 1 year  Medication Adjustments/Labs and Tests Ordered: Current medicines are reviewed at length with the patient today.  Concerns regarding medicines are outlined above.   No orders of the defined types were placed in this encounter.  No orders of the defined types were placed in this encounter.  There are no Patient Instructions on file for this visit.     Signed, Meriam Sprague, MD  03/07/2023 8:59 AM    [facility]

## 2023-03-07 ENCOUNTER — Ambulatory Visit: Payer: PPO | Attending: Cardiology | Admitting: Cardiology

## 2023-03-07 ENCOUNTER — Encounter: Payer: Self-pay | Admitting: Cardiology

## 2023-03-07 VITALS — BP 138/92 | HR 59 | Ht 72.0 in | Wt 239.4 lb

## 2023-03-07 DIAGNOSIS — E7800 Pure hypercholesterolemia, unspecified: Secondary | ICD-10-CM | POA: Diagnosis not present

## 2023-03-07 DIAGNOSIS — Z79899 Other long term (current) drug therapy: Secondary | ICD-10-CM

## 2023-03-07 DIAGNOSIS — I2510 Atherosclerotic heart disease of native coronary artery without angina pectoris: Secondary | ICD-10-CM | POA: Diagnosis not present

## 2023-03-07 DIAGNOSIS — I10 Essential (primary) hypertension: Secondary | ICD-10-CM

## 2023-03-07 MED ORDER — LOSARTAN POTASSIUM 25 MG PO TABS: 25.0000 mg | ORAL_TABLET | Freq: Every day | ORAL | 3 refills | Status: DC

## 2023-03-07 MED ORDER — ROSUVASTATIN CALCIUM 40 MG PO TABS: 40.0000 mg | ORAL_TABLET | Freq: Every day | ORAL | 3 refills | Status: DC

## 2023-03-07 MED ORDER — METOPROLOL SUCCINATE ER 25 MG PO TB24: 25.0000 mg | ORAL_TABLET | Freq: Every day | ORAL | 3 refills | Status: DC

## 2023-03-07 MED ORDER — NITROGLYCERIN 0.4 MG SL SUBL: 0.4000 mg | SUBLINGUAL_TABLET | SUBLINGUAL | 3 refills | Status: AC | PRN

## 2023-03-07 MED ORDER — EZETIMIBE 10 MG PO TABS: 10.0000 mg | ORAL_TABLET | Freq: Every day | ORAL | 3 refills | Status: AC

## 2023-03-07 NOTE — Patient Instructions (Signed)
Medication Instructions:   Your physician recommends that you continue on your current medications as directed. Please refer to the Current Medication list given to you today.  *If you need a refill on your cardiac medications before your next appointment, please call your pharmacy*    Follow-Up: At [facility], you and your health needs are our priority.  As part of our continuing mission to provide you with exceptional heart care, we have created designated Provider Care Teams.  These Care Teams include your primary Cardiologist (physician) and Advanced Practice Providers (APPs -  Physician Assistants and Nurse Practitioners) who all work together to provide you with the care you need, when you need it.  We recommend signing up for the patient portal called "MyChart".  Sign up information is provided on this After Visit Summary.  MyChart is used to connect with patients for Virtual Visits (Telemedicine).  Patients are able to view lab/test results, encounter notes, upcoming appointments, etc.  Non-urgent messages can be sent to your provider as well.   To learn more about what you can do with MyChart, go to [email or web address].    Your next appointment:   1 year(s)  Provider:   Heather E Pemberton, MD       

## 2023-05-23 ENCOUNTER — Other Ambulatory Visit: Payer: Self-pay | Admitting: *Deleted

## 2023-05-23 DIAGNOSIS — C61 Malignant neoplasm of prostate: Secondary | ICD-10-CM

## 2023-05-24 ENCOUNTER — Other Ambulatory Visit: Payer: PPO

## 2023-05-24 DIAGNOSIS — C61 Malignant neoplasm of prostate: Secondary | ICD-10-CM | POA: Diagnosis not present

## 2023-05-27 NOTE — Progress Notes (Addendum)
05/28/2023 10:58 AM   Andrew Fuentes 1955-12-06 [id number]  Referring provider: No referring provider defined for this encounter.  Urological history: 1. Prostate cancer -PSA (05/2023) - 0.2 -Intermediate risk (unfavorable) prostate cancer -6 months ADT given 08/2021 -I-125 interstitial implant 10/10/2021   2. BPH with LU TS -MRI prostate volume 35 cc  (2022)   3. High risk hematuria -Non-smoker -cysto, 02/2022 - NED  4.  Erectile dysfunction -contributing factors of age, prostate cancer, pelvic radiation, CAD, HLD, HTN,   Chief Complaint  Patient presents with   Follow-up   HPI: Andrew Fuentes is a 67 y.o. male who presents today for 6 month follow up.   Previous records reviewed.  Hx of CAD with stable angina, HLD and HTN.  Followed by cardiology.   I PSS 3/1  He has been experiencing intermittent painful urination.  Patient denies any modifying or aggravating factors.  Patient denies any recent UTI's, gross hematuria, dysuria or suprapubic/flank pain.  Patient denies any fevers, chills, nausea or vomiting.    PVR 2 mL  UA yellow slightly cloudy, specific gravity greater than 1.030, 2+ heme, pH 5.5, 2+ protein, 0-5 WBCs, 11-30 RBCs, 0-10 epithelial cells, granular casts are present, mucus threads are present and moderate bacteria.   IPSS     Row Name 05/28/23 1000         International Prostate Symptom Score   How often have you had the sensation of not emptying your bladder? Not at All     How often have you had to urinate less than every two hours? Less than 1 in 5 times     How often have you found you stopped and started again several times when you urinated? Not at All     How often have you found it difficult to postpone urination? Less than 1 in 5 times     How often have you had a weak urinary stream? Not at All     How often have you had to strain to start urination? Not at All     How many times did you typically get up at night to  urinate? 1 Time     Total IPSS Score 3       Quality of Life due to urinary symptoms   If you were to spend the rest of your life with your urinary condition just the way it is now how would you feel about that? Pleased               Score:  1-7 Mild 8-19 Moderate 20-35 Severe   SHIM 22  Patient still having spontaneous erections.  He denies any pain or curvature with erections.  Taking tadalafil 20 mg, on-demand-dosing    SHIM     Row Name 05/28/23 1043         SHIM: Over the last 6 months:   How do you rate your confidence that you could get and keep an erection? Moderate     When you had erections with sexual stimulation, how often were your erections hard enough for penetration (entering your partner)? Almost Always or Always     During sexual intercourse, how often were you able to maintain your erection after you had penetrated (entered) your partner? Most Times (much more than half the time)     During sexual intercourse, how difficult was it to maintain your erection to completion of intercourse? Not Difficult     When you attempted  sexual intercourse, how often was it satisfactory for you? Almost Always or Always       SHIM Total Score   SHIM 22                  Score: 1-7 Severe ED 8-11 Moderate ED 12-16 Mild-Moderate ED 17-21 Mild ED 22-25 No ED   PMH: Past Medical History:  Diagnosis Date   Cancer (HCC)    GERD (gastroesophageal reflux disease)    Hyperlipidemia    Hypertension    MRSA (methicillin resistant staph aureus) culture positive 2019    Surgical History: Past Surgical History:  Procedure Laterality Date   COLONOSCOPY  2011   [id number], TA x1   LEFT HEART CATH AND CORONARY ANGIOGRAPHY N/A 10/10/2020   Procedure: LEFT HEART CATH AND CORONARY ANGIOGRAPHY;  Surgeon: Corky Crafts, MD;  Location: [facility] INVASIVE CV LAB;  Service: Cardiovascular;  Laterality: N/A;   POLYPECTOMY  2011   TA   PROSTATE BIOPSY  07/21/2021    RADIOACTIVE SEED IMPLANT N/A 10/10/2021   Procedure: RADIOACTIVE SEED IMPLANT/BRACHYTHERAPY IMPLANT;  Surgeon: Riki Altes, MD;  Location: [facility] ORS;  Service: Urology;  Laterality: N/A;    Home Medications:  Allergies as of 05/28/2023       Reactions   Wound Dressing Adhesive Itching        Medication List        Accurate as of May 28, 2023 10:58 AM. If you have any questions, ask your nurse or doctor.          aspirin EC 81 MG tablet Take 1 tablet (81 mg total) by mouth daily. Swallow whole.   CALCIUM 1000 + D PO Take 1 tablet by mouth daily. (Plant Based)   ezetimibe 10 MG tablet Commonly known as: ZETIA Take 1 tablet (10 mg total) by mouth daily.   Gemtesa 75 MG Tabs Generic drug: Vibegron Take 1 tablet (75 mg total) by mouth daily.   ibuprofen 200 MG tablet Commonly known as: ADVIL Take 200 mg by mouth every 6 (six) hours as needed (pain.).   losartan 25 MG tablet Commonly known as: COZAAR Take 1 tablet (25 mg total) by mouth daily.   metoprolol succinate 25 MG 24 hr tablet Commonly known as: Toprol XL Take 1 tablet (25 mg total) by mouth daily.   multivitamin with minerals Tabs tablet Take 1 tablet by mouth every evening. Centum Multivitamin   nitroGLYCERIN 0.4 MG SL tablet Commonly known as: NITROSTAT Place 1 tablet (0.4 mg total) under the tongue every 5 (five) minutes as needed for chest pain.   PROBIOTIC ADVANCED PO Take 1 capsule by mouth in the morning.   rosuvastatin 40 MG tablet Commonly known as: CRESTOR Take 1 tablet (40 mg total) by mouth daily.   tadalafil 20 MG tablet Commonly known as: CIALIS Take 1 tablet (20 mg total) by mouth daily as needed for erectile dysfunction.   tamsulosin 0.4 MG Caps capsule Commonly known as: FLOMAX TAKE 1 CAPSULE BY MOUTH EVERY DAY AFTER SUPPER   TURMERIC PO Take 1 capsule by mouth in the morning.   Zinc 50 MG Tabs Take 50 mg by mouth in the morning.        Allergies:  Allergies   Allergen Reactions   Wound Dressing Adhesive Itching    Family History: Family History  Problem Relation Age of Onset   Heart attack Father 9   Colon cancer Neg Hx    Colon polyps Neg Hx  Esophageal cancer Neg Hx    Rectal cancer Neg Hx    Stomach cancer Neg Hx     Social History:  reports that he has never smoked. He has never used smokeless tobacco. He reports current alcohol use. He reports that he does not use drugs.  ROS: Pertinent ROS in HPI  Physical Exam: BP 122/79   Pulse 66   Ht 6' (1.829 m)   Wt 239 lb (108.4 kg)   BMI 32.41 kg/m   Constitutional:  Well nourished. Alert and oriented, No acute distress. HEENT: [place] AT, moist mucus membranes.  Trachea midline Cardiovascular: No clubbing, cyanosis, or edema. Respiratory: Normal respiratory effort, no increased work of breathing. Neurologic: Grossly intact, no focal deficits, moving all 4 extremities. Psychiatric: Normal mood and affect.  Laboratory Data: Component     Latest Ref Rng 05/24/2023  Prostate Specific Ag, Serum     0.0 - 4.0 ng/mL 0.2      Urinalysis Results for orders placed or performed in visit on 05/28/23  Microscopic Examination   Urine  Result Value Ref Range   WBC, UA 0-5 0 - 5 /hpf   RBC, Urine 11-30 (A) 0 - 2 /hpf   Epithelial Cells (non renal) 0-10 0 - 10 /hpf   Casts Present (A) None seen /lpf   Cast Type Granular casts (A) N/A   Mucus, UA Present (A) Not Estab.   Bacteria, UA Moderate (A) None seen/Few  Urinalysis, Complete  Result Value Ref Range   Specific Gravity, UA >1.030 (H) 1.005 - 1.030   pH, UA 5.5 5.0 - 7.5   Color, UA Yellow Yellow   Appearance Ur Hazy (A) Clear   Leukocytes,UA Negative Negative   Protein,UA 2+ (A) Negative/Trace   Glucose, UA Negative Negative   Ketones, UA Negative Negative   RBC, UA 2+ (A) Negative   Bilirubin, UA Negative Negative   Urobilinogen, Ur 0.2 0.2 - 1.0 mg/dL   Nitrite, UA Negative Negative   Microscopic Examination See below:    BLADDER SCAN AMB NON-IMAGING  Result Value Ref Range   Scan Result 2 ml   I have reviewed the labs.   Pertinent Imaging:  05/28/23 10:49  Scan Result 2 ml      Assessment & Plan:    1. Prostate cancer -PSA has increased from <0.1 to 0.2 over the last 6 months -explained that this is not unusual after radiation treatment as there is still non-cancerous prostate tissue that continues to grow, but we need to keep a close eye on his levels, will repeat the PSA in 4 months  2. BPH with LU TS -PVR demonstrates adequate emptying   3. High risk hematuria -cysto (2023) - NED  -UA 11-30 RBC's -patient is experiencing some dysuria so we will send for culture to rule out indolent infection  4. ED -At goal with tadalafil 20 mg on-demand dosing  Return in about 4 months (around 09/27/2023) for PSA only .  These notes generated with voice recognition software. I apologize for typographical errors.  Cloretta Ned  [facility] [address]  [address] [place], [place] [address] [phone]

## 2023-05-28 ENCOUNTER — Ambulatory Visit: Payer: PPO | Admitting: Urology

## 2023-05-28 ENCOUNTER — Encounter: Payer: Self-pay | Admitting: Urology

## 2023-05-28 VITALS — BP 122/79 | HR 66 | Ht 72.0 in | Wt 239.0 lb

## 2023-05-28 DIAGNOSIS — R319 Hematuria, unspecified: Secondary | ICD-10-CM

## 2023-05-28 DIAGNOSIS — R300 Dysuria: Secondary | ICD-10-CM | POA: Diagnosis not present

## 2023-05-28 DIAGNOSIS — N401 Enlarged prostate with lower urinary tract symptoms: Secondary | ICD-10-CM

## 2023-05-28 DIAGNOSIS — N138 Other obstructive and reflux uropathy: Secondary | ICD-10-CM | POA: Diagnosis not present

## 2023-05-28 DIAGNOSIS — C61 Malignant neoplasm of prostate: Secondary | ICD-10-CM | POA: Diagnosis not present

## 2023-05-28 DIAGNOSIS — N5235 Erectile dysfunction following radiation therapy: Secondary | ICD-10-CM

## 2023-05-28 LAB — MICROSCOPIC EXAMINATION

## 2023-05-28 LAB — URINALYSIS, COMPLETE
Bilirubin, UA: NEGATIVE
Glucose, UA: NEGATIVE
Ketones, UA: NEGATIVE
Leukocytes,UA: NEGATIVE
Nitrite, UA: NEGATIVE
Specific Gravity, UA: >1.03 — ABNORMAL HIGH (ref 1.005–1.030)
Urobilinogen, Ur: 0.2 mg/dL (ref 0.2–1.0)
pH, UA: 5.5 (ref 5.0–7.5)

## 2023-05-28 LAB — BLADDER SCAN AMB NON-IMAGING: Scan Result: 2

## 2023-05-31 ENCOUNTER — Other Ambulatory Visit: Payer: Self-pay | Admitting: Urology

## 2023-05-31 DIAGNOSIS — R3129 Other microscopic hematuria: Secondary | ICD-10-CM

## 2023-06-07 ENCOUNTER — Ambulatory Visit
Admission: RE | Admit: 2023-06-07 | Discharge: 2023-06-07 | Disposition: A | Discharge status: Home / Self Care | Payer: PPO | Source: Ambulatory Visit | Attending: Urology | Admitting: Urology

## 2023-06-07 DIAGNOSIS — R935 Abnormal findings on diagnostic imaging of other abdominal regions, including retroperitoneum: Secondary | ICD-10-CM | POA: Diagnosis not present

## 2023-06-07 DIAGNOSIS — N200 Calculus of kidney: Secondary | ICD-10-CM | POA: Diagnosis not present

## 2023-06-07 DIAGNOSIS — R3129 Other microscopic hematuria: Secondary | ICD-10-CM | POA: Diagnosis not present

## 2023-06-07 DIAGNOSIS — K449 Diaphragmatic hernia without obstruction or gangrene: Secondary | ICD-10-CM | POA: Diagnosis not present

## 2023-06-07 MED ORDER — IOHEXOL 300 MG/ML  SOLN
100.0000 mL | Freq: Once | INTRAMUSCULAR | Status: AC | PRN
  Administered 2023-06-07: 100 mL via INTRAVENOUS

## 2023-06-16 ENCOUNTER — Other Ambulatory Visit: Payer: Self-pay | Admitting: Urology

## 2023-06-16 DIAGNOSIS — C61 Malignant neoplasm of prostate: Secondary | ICD-10-CM

## 2023-09-25 ENCOUNTER — Other Ambulatory Visit: Payer: PPO

## 2023-09-25 DIAGNOSIS — C61 Malignant neoplasm of prostate: Secondary | ICD-10-CM

## 2023-09-26 LAB — PSA: Prostate Specific Ag, Serum: 0.1 ng/mL (ref 0.0–4.0)

## 2023-09-28 ENCOUNTER — Other Ambulatory Visit: Payer: Self-pay | Admitting: Radiation Oncology

## 2024-01-07 DIAGNOSIS — H524 Presbyopia: Secondary | ICD-10-CM | POA: Diagnosis not present

## 2024-02-19 ENCOUNTER — Other Ambulatory Visit: Payer: Self-pay | Admitting: *Deleted

## 2024-02-19 DIAGNOSIS — C61 Malignant neoplasm of prostate: Secondary | ICD-10-CM

## 2024-02-26 ENCOUNTER — Inpatient Hospital Stay: Payer: PPO | Attending: Radiation Oncology

## 2024-02-26 DIAGNOSIS — C61 Malignant neoplasm of prostate: Secondary | ICD-10-CM | POA: Insufficient documentation

## 2024-02-26 LAB — PSA: Prostatic Specific Antigen: 0.09 ng/mL (ref 0.00–4.00)

## 2024-03-03 DIAGNOSIS — C61 Malignant neoplasm of prostate: Secondary | ICD-10-CM | POA: Diagnosis not present

## 2024-03-03 DIAGNOSIS — E785 Hyperlipidemia, unspecified: Secondary | ICD-10-CM | POA: Diagnosis not present

## 2024-03-03 DIAGNOSIS — I2510 Atherosclerotic heart disease of native coronary artery without angina pectoris: Secondary | ICD-10-CM | POA: Diagnosis not present

## 2024-03-03 DIAGNOSIS — I10 Essential (primary) hypertension: Secondary | ICD-10-CM | POA: Diagnosis not present

## 2024-03-03 DIAGNOSIS — L719 Rosacea, unspecified: Secondary | ICD-10-CM | POA: Diagnosis not present

## 2024-03-04 ENCOUNTER — Encounter: Payer: Self-pay | Admitting: Radiation Oncology

## 2024-03-04 ENCOUNTER — Ambulatory Visit
Admission: RE | Admit: 2024-03-04 | Discharge: 2024-03-04 | Disposition: A | Discharge status: Home / Self Care | Payer: PPO | Source: Ambulatory Visit | Attending: Radiation Oncology | Admitting: Radiation Oncology

## 2024-03-04 VITALS — BP 138/94 | HR 62 | Resp 16 | Ht 72.0 in | Wt 242.0 lb

## 2024-03-04 DIAGNOSIS — C61 Malignant neoplasm of prostate: Secondary | ICD-10-CM | POA: Diagnosis not present

## 2024-03-04 DIAGNOSIS — Z191 Hormone sensitive malignancy status: Secondary | ICD-10-CM | POA: Diagnosis not present

## 2024-03-04 DIAGNOSIS — Z923 Personal history of irradiation: Secondary | ICD-10-CM | POA: Insufficient documentation

## 2024-03-04 NOTE — Progress Notes (Signed)
 Radiation Oncology Follow up Note  Name: Andrew Fuentes   Date:   03/04/2024 MRN:  [id number] DOB: 02-Jan-1956    This 68 y.o. male presents to the clinic today for 30-month follow-up status post I-125 interstitial implant for Gleason 7 (3+4) adenocarcinoma the prostate.  REFERRING PROVIDER: No ref. provider found  HPI: Patient is a 68 year old male now out 28 months having completed I-125 interstitial implant for a Gleason 7 adenocarcinoma the prostate seen today in routine follow-up he is asymptomatic specifically denies any increased lower urinary tract symptoms diarrhea or fatigue.Aaron Aas  His PSA is fairly unchanged is 0.09 was 0.06 a year ago.  COMPLICATIONS OF TREATMENT: none  FOLLOW UP COMPLIANCE: keeps appointments   PHYSICAL EXAM:  BP (!) 138/94 Comment: 2nd BP  Pulse 62   Resp 16   Ht 6' (1.829 m)   Wt 242 lb (109.8 kg)   BMI 32.82 kg/m  Well-developed well-nourished patient in NAD. HEENT reveals PERLA, EOMI, discs not visualized.  Oral cavity is clear. No oral mucosal lesions are identified. Neck is clear without evidence of cervical or supraclavicular adenopathy. Lungs are clear to A&P. Cardiac examination is essentially unremarkable with regular rate and rhythm without murmur rub or thrill. Abdomen is benign with no organomegaly or masses noted. Motor sensory and DTR levels are equal and symmetric in the upper and lower extremities. Cranial nerves II through XII are grossly intact. Proprioception is intact. No peripheral adenopathy or edema is identified. No motor or sensory levels are noted. Crude visual fields are within normal range.  RADIOLOGY RESULTS: No current films for review  PLAN: At the present time patient is under excellent biochemical control of his prostate cancer.  I am going to turn follow-up care over to urology since they are seeing him twice a year.  I be happy to reevaluate him anytime in the future should that be indicated.  Patient knows to call with any  concerns.  I would like to take this opportunity to thank you for allowing me to participate in the care of your patient.Glenis Langdon, MD

## 2024-03-30 DIAGNOSIS — I2510 Atherosclerotic heart disease of native coronary artery without angina pectoris: Secondary | ICD-10-CM | POA: Diagnosis not present

## 2024-03-30 DIAGNOSIS — I10 Essential (primary) hypertension: Secondary | ICD-10-CM | POA: Diagnosis not present

## 2024-03-30 DIAGNOSIS — E785 Hyperlipidemia, unspecified: Secondary | ICD-10-CM | POA: Diagnosis not present

## 2024-04-14 DIAGNOSIS — E785 Hyperlipidemia, unspecified: Secondary | ICD-10-CM | POA: Diagnosis not present

## 2024-04-14 DIAGNOSIS — I10 Essential (primary) hypertension: Secondary | ICD-10-CM | POA: Diagnosis not present

## 2024-04-14 DIAGNOSIS — I2510 Atherosclerotic heart disease of native coronary artery without angina pectoris: Secondary | ICD-10-CM | POA: Diagnosis not present

## 2024-04-14 DIAGNOSIS — H00012 Hordeolum externum right lower eyelid: Secondary | ICD-10-CM | POA: Diagnosis not present

## 2024-05-01 ENCOUNTER — Other Ambulatory Visit: Payer: Self-pay

## 2024-05-01 MED ORDER — LOSARTAN POTASSIUM 25 MG PO TABS: 25.0000 mg | ORAL_TABLET | Freq: Every day | ORAL | 0 refills | Status: DC

## 2024-05-01 MED ORDER — METOPROLOL SUCCINATE ER 25 MG PO TB24: 25.0000 mg | ORAL_TABLET | Freq: Every day | ORAL | 0 refills | Status: DC

## 2024-05-04 ENCOUNTER — Other Ambulatory Visit: Payer: Self-pay

## 2024-05-04 DIAGNOSIS — I2510 Atherosclerotic heart disease of native coronary artery without angina pectoris: Secondary | ICD-10-CM

## 2024-05-04 DIAGNOSIS — E7800 Pure hypercholesterolemia, unspecified: Secondary | ICD-10-CM

## 2024-05-04 DIAGNOSIS — Z79899 Other long term (current) drug therapy: Secondary | ICD-10-CM

## 2024-05-04 DIAGNOSIS — I10 Essential (primary) hypertension: Secondary | ICD-10-CM

## 2024-05-04 MED ORDER — ROSUVASTATIN CALCIUM 40 MG PO TABS: 40.0000 mg | ORAL_TABLET | Freq: Every day | ORAL | 0 refills | Status: DC

## 2024-06-04 ENCOUNTER — Other Ambulatory Visit: Payer: Self-pay | Admitting: Physician Assistant

## 2024-06-04 DIAGNOSIS — Z79899 Other long term (current) drug therapy: Secondary | ICD-10-CM

## 2024-06-04 DIAGNOSIS — E7800 Pure hypercholesterolemia, unspecified: Secondary | ICD-10-CM

## 2024-06-04 DIAGNOSIS — I2510 Atherosclerotic heart disease of native coronary artery without angina pectoris: Secondary | ICD-10-CM

## 2024-06-04 DIAGNOSIS — I10 Essential (primary) hypertension: Secondary | ICD-10-CM

## 2024-06-12 ENCOUNTER — Other Ambulatory Visit: Payer: Self-pay

## 2024-06-12 DIAGNOSIS — C61 Malignant neoplasm of prostate: Secondary | ICD-10-CM

## 2024-06-15 ENCOUNTER — Other Ambulatory Visit: Payer: Self-pay

## 2024-06-15 DIAGNOSIS — C61 Malignant neoplasm of prostate: Secondary | ICD-10-CM

## 2024-06-15 NOTE — Progress Notes (Unsigned)
 06/17/2024 8:37 PM   Andrew Fuentes [facility] May 10, 1956 [id number]  Referring provider: No referring provider defined for this encounter.  Urological history: 1. Prostate cancer -PSA (05/2024) *** -Intermediate risk (unfavorable) prostate cancer -6 months ADT given 08/2021 -I-125 interstitial implant 10/10/2021   2. BPH with LU TS -MRI prostate volume 35 cc  (2022)   3. High risk hematuria -Non-smoker -CTU (05/2023) left renal stone -cysto, 02/2022 - NED  4.  Erectile dysfunction -contributing factors of age, prostate cancer, pelvic radiation, CAD, HLD, HTN, - Tadalafil  20 mg, on-demand dosing  5. Non obstructive calculus of the left kidney  No chief complaint on file.  HPI: Andrew Fuentes is a 68 y.o. male who presents today for 6 month follow up.   Previous records reviewed.    UA ***  PSA ***  Serum creatinine of 1.00 with a EGFR of 81 in June of this year.  Total cholesterol 122 in June of this year.     PMH: Past Medical History:  Diagnosis Date   Cancer (HCC)    GERD (gastroesophageal reflux disease)    Hyperlipidemia    Hypertension    MRSA (methicillin resistant staph aureus) culture positive 2019    Surgical History: Past Surgical History:  Procedure Laterality Date   COLONOSCOPY  2011   [id number], TA x1   LEFT HEART CATH AND CORONARY ANGIOGRAPHY N/A 10/10/2020   Procedure: LEFT HEART CATH AND CORONARY ANGIOGRAPHY;  Surgeon: Dann Candyce RAMAN, MD;  Location: [facility] INVASIVE CV LAB;  Service: Cardiovascular;  Laterality: N/A;   POLYPECTOMY  2011   TA   PROSTATE BIOPSY  07/21/2021   RADIOACTIVE SEED IMPLANT N/A 10/10/2021   Procedure: RADIOACTIVE SEED IMPLANT/BRACHYTHERAPY IMPLANT;  Surgeon: Twylla Glendia BROCKS, MD;  Location: [facility] ORS;  Service: Urology;  Laterality: N/A;    Home Medications:  Allergies as of 06/17/2024       Reactions   Wound Dressing Adhesive Itching        Medication List        Accurate as of June 15, 2024   8:37 PM. If you have any questions, ask your nurse or doctor.          aspirin  EC 81 MG tablet Take 1 tablet (81 mg total) by mouth daily. Swallow whole.   CALCIUM  1000 + D PO Take 1 tablet by mouth daily. (Plant Based)   ezetimibe  10 MG tablet Commonly known as: ZETIA  Take 1 tablet (10 mg total) by mouth daily.   Gemtesa  75 MG Tabs Generic drug: Vibegron  Take 1 tablet (75 mg total) by mouth daily.   ibuprofen  200 MG tablet Commonly known as: ADVIL  Take 200 mg by mouth every 6 (six) hours as needed (pain.).   losartan  25 MG tablet Commonly known as: COZAAR  Take 1 tablet (25 mg total) by mouth daily. Patient must call and schedule annual appointment for further refills second attempt   metoprolol  succinate 25 MG 24 hr tablet Commonly known as: TOPROL -XL Take 1 tablet (25 mg total) by mouth daily. Patient must call and schedule annual appointment for further refills second attempt   multivitamin with minerals Tabs tablet Take 1 tablet by mouth every evening. Centum Multivitamin   nitroGLYCERIN  0.4 MG SL tablet Commonly known as: NITROSTAT  Place 1 tablet (0.4 mg total) under the tongue every 5 (five) minutes as needed for chest pain.   PROBIOTIC ADVANCED PO Take 1 capsule by mouth in the morning.   rosuvastatin  40 MG tablet Commonly known  as: CRESTOR  Take 1 tablet (40 mg total) by mouth daily. Patient must call and schedule annual appointment for further refills second attempt   tadalafil  20 MG tablet Commonly known as: CIALIS  Take 1 tablet (20 mg total) by mouth daily as needed for erectile dysfunction.   tamsulosin  0.4 MG Caps capsule Commonly known as: FLOMAX  TAKE 1 CAPSULE BY MOUTH EVERY DAY AFTER SUPPER   TURMERIC PO Take 1 capsule by mouth in the morning.   Zinc  50 MG Tabs Take 50 mg by mouth in the morning.        Allergies:  Allergies  Allergen Reactions   Wound Dressing Adhesive Itching    Family History: Family History  Problem Relation  Age of Onset   Heart attack Father 58   Colon cancer Neg Hx    Colon polyps Neg Hx    Esophageal cancer Neg Hx    Rectal cancer Neg Hx    Stomach cancer Neg Hx     Social History:  reports that he has never smoked. He has never used smokeless tobacco. He reports current alcohol use. He reports that he does not use drugs.  ROS: Pertinent ROS in HPI  Physical Exam: There were no vitals taken for this visit.  Constitutional:  Well nourished. Alert and oriented, No acute distress. HEENT: [place] AT, moist mucus membranes.  Trachea midline, no masses. Cardiovascular: No clubbing, cyanosis, or edema. Respiratory: Normal respiratory effort, no increased work of breathing. GI: Abdomen is soft, non tender, non distended, no abdominal masses. Liver and spleen not palpable.  No hernias appreciated.  Stool sample for occult testing is not indicated.   GU: No CVA tenderness.  No bladder fullness or masses.  Patient with circumcised/uncircumcised phallus. ***Foreskin easily retracted***  Urethral meatus is patent.  No penile discharge. No penile lesions or rashes. Scrotum without lesions, cysts, rashes and/or edema.  Testicles are located scrotally bilaterally. No masses are appreciated in the testicles. Left and right epididymis are normal. Rectal: Patient with  normal sphincter tone. Anus and perineum without scarring or rashes. No rectal masses are appreciated. Prostate is approximately *** grams, *** nodules are appreciated. Seminal vesicles are normal. Skin: No rashes, bruises or suspicious lesions. Lymph: No cervical or inguinal adenopathy. Neurologic: Grossly intact, no focal deficits, moving all 4 extremities. Psychiatric: Normal mood and affect.   Laboratory Data: See EPIC and HPI I have reviewed the labs.   Pertinent Imaging: N/A     Assessment & Plan:    1. Prostate cancer -PSA has increased from <0.1 to 0.2 over the last 6 months -explained that this is not unusual after radiation  treatment as there is still non-cancerous prostate tissue that continues to grow, but we need to keep a close eye on his levels, will repeat the PSA in 4 months  2. BPH with LU TS -PVR demonstrates adequate emptying   3. High risk hematuria -cysto (2023) - NED  -UA 11-30 RBC's -patient is experiencing some dysuria so we will send for culture to rule out indolent infection  4. ED -At goal with tadalafil  20 mg on-demand dosing  No follow-ups on file.  These notes generated with voice recognition software. I apologize for typographical errors.  CLOTILDA HELON RIGGERS  [facility] [address]  [address] [place], [place] [address] [phone]

## 2024-06-16 LAB — PSA: Prostate Specific Ag, Serum: <0.1 ng/mL (ref 0.0–4.0)

## 2024-06-17 ENCOUNTER — Encounter: Payer: Self-pay | Admitting: Urology

## 2024-06-17 ENCOUNTER — Ambulatory Visit: Payer: Self-pay | Admitting: Urology

## 2024-06-17 VITALS — BP 129/80 | HR 62 | Ht 72.0 in | Wt 230.0 lb

## 2024-06-17 DIAGNOSIS — C61 Malignant neoplasm of prostate: Secondary | ICD-10-CM | POA: Diagnosis not present

## 2024-06-17 DIAGNOSIS — N138 Other obstructive and reflux uropathy: Secondary | ICD-10-CM

## 2024-06-17 DIAGNOSIS — N5235 Erectile dysfunction following radiation therapy: Secondary | ICD-10-CM

## 2024-06-17 DIAGNOSIS — N401 Enlarged prostate with lower urinary tract symptoms: Secondary | ICD-10-CM

## 2024-06-17 DIAGNOSIS — R319 Hematuria, unspecified: Secondary | ICD-10-CM

## 2024-06-17 LAB — URINALYSIS, COMPLETE
Bilirubin, UA: NEGATIVE
Glucose, UA: NEGATIVE
Ketones, UA: NEGATIVE
Leukocytes,UA: NEGATIVE
Nitrite, UA: NEGATIVE
Protein,UA: NEGATIVE
Specific Gravity, UA: 1.03 (ref 1.005–1.030)
Urobilinogen, Ur: 1 mg/dL (ref 0.2–1.0)
pH, UA: 6 (ref 5.0–7.5)

## 2024-06-17 LAB — MICROSCOPIC EXAMINATION

## 2024-06-17 MED ORDER — TADALAFIL 20 MG PO TABS: 20.0000 mg | ORAL_TABLET | Freq: Every day | ORAL | 3 refills | Status: AC | PRN

## 2024-06-29 DIAGNOSIS — Z20822 Contact with and (suspected) exposure to covid-19: Secondary | ICD-10-CM | POA: Diagnosis not present

## 2024-06-29 DIAGNOSIS — R509 Fever, unspecified: Secondary | ICD-10-CM | POA: Diagnosis not present

## 2024-12-18 ENCOUNTER — Other Ambulatory Visit
